# Patient Record
Sex: Male | Born: 1965 | Race: White | Hispanic: No | Marital: Married | Smoking: Former smoker
Health system: Southern US, Community
[De-identification: ages and names within clinical notes are randomized; demographics above are authoritative.]

## PROBLEM LIST (undated history)

## (undated) DIAGNOSIS — M1990 Unspecified osteoarthritis, unspecified site: Secondary | ICD-10-CM

## (undated) DIAGNOSIS — R3915 Urgency of urination: Secondary | ICD-10-CM

## (undated) DIAGNOSIS — R350 Frequency of micturition: Secondary | ICD-10-CM

## (undated) DIAGNOSIS — Z87442 Personal history of urinary calculi: Secondary | ICD-10-CM

---

## 2004-10-28 HISTORY — PX: EXTRACORPOREAL SHOCK WAVE LITHOTRIPSY: SHX1557

## 2010-08-09 ENCOUNTER — Emergency Department (HOSPITAL_COMMUNITY): Admission: EM | Admit: 2010-08-09 | Discharge: 2010-08-09 | Payer: Self-pay | Admitting: Emergency Medicine

## 2011-01-10 LAB — DIFFERENTIAL
Basophils Relative: 0 % (ref 0–1)
Lymphs Abs: 0.9 10*3/uL (ref 0.7–4.0)
Monocytes Absolute: 0.5 10*3/uL (ref 0.1–1.0)
Monocytes Relative: 6 % (ref 3–12)
Neutro Abs: 7.5 10*3/uL (ref 1.7–7.7)
Neutrophils Relative %: 84 % — ABNORMAL HIGH (ref 43–77)

## 2011-01-10 LAB — CBC
HCT: 47.1 % (ref 39.0–52.0)
Hemoglobin: 16.5 g/dL (ref 13.0–17.0)
MCHC: 35 g/dL (ref 30.0–36.0)
RBC: 4.96 MIL/uL (ref 4.22–5.81)

## 2011-01-10 LAB — BASIC METABOLIC PANEL
CO2: 27 mEq/L (ref 19–32)
GFR calc Af Amer: >60 mL/min (ref >60)
GFR calc non Af Amer: >60 mL/min (ref >60)
Glucose, Bld: 134 mg/dL — ABNORMAL HIGH (ref 70–99)
Potassium: 4.2 mEq/L (ref 3.5–5.1)
Sodium: 144 mEq/L (ref 135–145)

## 2013-05-02 ENCOUNTER — Encounter (HOSPITAL_BASED_OUTPATIENT_CLINIC_OR_DEPARTMENT_OTHER): Payer: Self-pay | Admitting: Emergency Medicine

## 2013-05-02 ENCOUNTER — Emergency Department (HOSPITAL_BASED_OUTPATIENT_CLINIC_OR_DEPARTMENT_OTHER)
Admission: EM | Admit: 2013-05-02 | Discharge: 2013-05-02 | Disposition: A | Discharge status: Home / Self Care | Payer: BC Managed Care – PPO | Attending: Emergency Medicine | Admitting: Emergency Medicine

## 2013-05-02 DIAGNOSIS — T782XXA Anaphylactic shock, unspecified, initial encounter: Secondary | ICD-10-CM

## 2013-05-02 DIAGNOSIS — L509 Urticaria, unspecified: Secondary | ICD-10-CM | POA: Insufficient documentation

## 2013-05-02 DIAGNOSIS — R000 Tachycardia, unspecified: Secondary | ICD-10-CM | POA: Insufficient documentation

## 2013-05-02 DIAGNOSIS — T63481A Toxic effect of venom of other arthropod, accidental (unintentional), initial encounter: Secondary | ICD-10-CM | POA: Insufficient documentation

## 2013-05-02 DIAGNOSIS — T6391XA Toxic effect of contact with unspecified venomous animal, accidental (unintentional), initial encounter: Secondary | ICD-10-CM | POA: Insufficient documentation

## 2013-05-02 DIAGNOSIS — Y929 Unspecified place or not applicable: Secondary | ICD-10-CM | POA: Insufficient documentation

## 2013-05-02 DIAGNOSIS — Y939 Activity, unspecified: Secondary | ICD-10-CM | POA: Insufficient documentation

## 2013-05-02 MED ORDER — EPINEPHRINE 0.3 MG/0.3ML IJ SOAJ: 0.3000 mg | INTRAMUSCULAR | Status: DC | PRN

## 2013-05-02 MED ORDER — DIPHENHYDRAMINE HCL 50 MG/ML IJ SOLN
25.0000 mg | Freq: Once | INTRAMUSCULAR | Status: AC
  Administered 2013-05-02: 25 mg via INTRAVENOUS
  Filled 2013-05-02: qty 1

## 2013-05-02 MED ORDER — FAMOTIDINE IN NACL 20-0.9 MG/50ML-% IV SOLN
20.0000 mg | Freq: Once | INTRAVENOUS | Status: AC
  Administered 2013-05-02: 20 mg via INTRAVENOUS
  Filled 2013-05-02: qty 50

## 2013-05-02 NOTE — ED Notes (Addendum)
Pt was stung x3 by yellowjackets in left thigh and left neck.  No pt h/o allergic reaction to stings, but quickkly felt tightness in throat and urticaria.  [facility] and given 125mg  solumedrol IM, and 25mg  benadryl IM at 1310 today.  Pt symptoms improvement within first 5 minutes.   EMS vital hr 102, 137/90, resp 18. Sat 97% on 3L.  Denies any allergies, medications or medical history.  20gauge IV in right AC placed by EMS.

## 2013-05-02 NOTE — ED Notes (Signed)
Cheese and crackers provided to pt and wife

## 2013-05-02 NOTE — ED Notes (Signed)
Patient to remain under ED observation for 4-6 hours per Dr Radford Pax.  IV running at [facility] rate with NS.

## 2013-05-02 NOTE — ED Provider Notes (Signed)
   History    CSN: [id number] Arrival date & time 05/02/13  1325  First MD Initiated Contact with Patient 05/02/13 1348     Chief Complaint  Patient presents with  . Allergic Reaction    HPI Pt was stung x3 by yellowjackets in left thigh and left neck. No pt h/o allergic reaction to stings, but quickkly felt tightness in throat and urticaria. [facility] and given 125mg  solumedrol IM, and 50mg  benadryl IM at 1310 today. Pt symptoms improvement within first 5 minutes. EMS vital hr 102, 137/90, resp 18. Sat 97% on 3L. Denies any allergies, medications or medical history. 20gauge IV in right AC placed by EMS.  History reviewed. No pertinent past medical history. History reviewed. No pertinent past surgical history. History reviewed. No pertinent family history. History  Substance Use Topics  . Smoking status: Not on file  . Smokeless tobacco: Never Used  . Alcohol Use: Yes     Comment: 4 drinks a day - beer wine or liquor    Review of Systems  Allergies  Bee venom  Home Medications   Current Outpatient Rx  Name  Route  Sig  Dispense  Refill  . diphenhydrAMINE (SOMINEX) 25 MG tablet   Intramuscular   Inject 25 mg into the muscle once.         Marland Kitchen EPINEPHrine 1 MG/ML SOLN   Intramuscular   Inject 0.3 mg into the muscle once.         . methylPREDNISolone sodium succinate (SOLU-MEDROL) 125 mg/2 mL injection   Intramuscular   Inject 125 mg into the muscle once.         Marland Kitchen EPINEPHrine (EPIPEN) 0.3 mg/0.3 mL SOAJ   Intramuscular   Inject 0.3 mLs (0.3 mg total) into the muscle as needed.   2 Device   0    BP 130/77  Pulse 94  Temp(Src) 97.7 F (36.5 C) (Oral)  Resp 14  Ht 5\' 9"  (1.753 m)  Wt 230 lb (104.327 kg)  BMI 33.95 kg/m2  SpO2 99% Physical Exam  Nursing note and vitals reviewed. Constitutional: He is oriented to person, place, and time. He appears well-developed and well-nourished. No distress.  HENT:  Head: Normocephalic and  atraumatic.  Eyes: Pupils are equal, round, and reactive to light.  Neck: Normal range of motion.  Cardiovascular: Intact distal pulses.  Tachycardia present.   Pulmonary/Chest: No respiratory distress. He has no wheezes.  Abdominal: Normal appearance. He exhibits no distension.  Musculoskeletal: Normal range of motion.  Neurological: He is alert and oriented to person, place, and time. No cranial nerve deficit.  Skin: Skin is warm and dry. Rash (Urticarial rash noted chest and left neck area.) noted.  Psychiatric: He has a normal mood and affect. His behavior is normal.    ED Course  Procedures (including critical care time) Medications  famotidine (PEPCID) IVPB 20 mg (0 mg Intravenous Stopped 05/02/13 1431)  diphenhydrAMINE (BENADRYL) injection 25 mg (25 mg Intravenous Given 05/02/13 1500)    Labs Reviewed - No data to display No results found. 1. Anaphylactic reaction, initial encounter   2. Wasp sting, initial encounter     MDM    Nelia Shi, MD 05/02/13 [phone]

## 2013-05-02 NOTE — ED Notes (Signed)
Ginger Ale provided to pt 

## 2016-04-15 ENCOUNTER — Other Ambulatory Visit: Payer: Self-pay | Admitting: Urology

## 2016-05-07 ENCOUNTER — Encounter (HOSPITAL_BASED_OUTPATIENT_CLINIC_OR_DEPARTMENT_OTHER): Payer: Self-pay | Admitting: *Deleted

## 2016-05-07 NOTE — Progress Notes (Signed)
NPO AFTER MN.  ARRIVE AT 1030.  NEEDS HG.  

## 2016-05-15 ENCOUNTER — Ambulatory Visit (HOSPITAL_BASED_OUTPATIENT_CLINIC_OR_DEPARTMENT_OTHER): Payer: Managed Care, Other (non HMO) | Admitting: Anesthesiology

## 2016-05-15 ENCOUNTER — Ambulatory Visit (HOSPITAL_BASED_OUTPATIENT_CLINIC_OR_DEPARTMENT_OTHER)
Admission: RE | Admit: 2016-05-15 | Discharge: 2016-05-15 | Disposition: A | Discharge status: Home / Self Care | Payer: Managed Care, Other (non HMO) | Source: Ambulatory Visit | Attending: Urology | Admitting: Urology

## 2016-05-15 ENCOUNTER — Encounter (HOSPITAL_BASED_OUTPATIENT_CLINIC_OR_DEPARTMENT_OTHER)
Admission: RE | Disposition: A | Discharge status: Home / Self Care | Payer: Self-pay | Source: Ambulatory Visit | Attending: Urology

## 2016-05-15 ENCOUNTER — Encounter (HOSPITAL_BASED_OUTPATIENT_CLINIC_OR_DEPARTMENT_OTHER): Payer: Self-pay

## 2016-05-15 DIAGNOSIS — M1990 Unspecified osteoarthritis, unspecified site: Secondary | ICD-10-CM | POA: Insufficient documentation

## 2016-05-15 DIAGNOSIS — Z87891 Personal history of nicotine dependence: Secondary | ICD-10-CM | POA: Diagnosis not present

## 2016-05-15 DIAGNOSIS — Z302 Encounter for sterilization: Secondary | ICD-10-CM | POA: Insufficient documentation

## 2016-05-15 HISTORY — DX: Personal history of urinary calculi: Z87.442

## 2016-05-15 HISTORY — DX: Unspecified osteoarthritis, unspecified site: M19.90

## 2016-05-15 HISTORY — DX: Urgency of urination: R39.15

## 2016-05-15 HISTORY — DX: Frequency of micturition: R35.0

## 2016-05-15 HISTORY — PX: VASECTOMY: SHX75

## 2016-05-15 LAB — POCT HEMOGLOBIN-HEMACUE: HEMOGLOBIN: 15.1 g/dL (ref 13.0–17.0)

## 2016-05-15 SURGERY — VASECTOMY
Anesthesia: General | Site: Scrotum | Laterality: Bilateral

## 2016-05-15 MED ORDER — MIDAZOLAM HCL 5 MG/5ML IJ SOLN
INTRAMUSCULAR | Status: DC | PRN
  Administered 2016-05-15: 2 mg via INTRAVENOUS

## 2016-05-15 MED ORDER — LACTATED RINGERS IV SOLN
INTRAVENOUS | Status: DC
  Administered 2016-05-15 (×2): via INTRAVENOUS
  Filled 2016-05-15: qty 1000

## 2016-05-15 MED ORDER — OXYCODONE HCL 5 MG/5ML PO SOLN
5.0000 mg | Freq: Once | ORAL | Status: DC | PRN
  Filled 2016-05-15: qty 5

## 2016-05-15 MED ORDER — TRAMADOL HCL 50 MG PO TABS: 50.0000 mg | ORAL_TABLET | Freq: Four times a day (QID) | ORAL | Status: DC | PRN

## 2016-05-15 MED ORDER — FENTANYL CITRATE (PF) 100 MCG/2ML IJ SOLN
INTRAMUSCULAR | Status: AC
  Filled 2016-05-15: qty 2

## 2016-05-15 MED ORDER — SODIUM CHLORIDE 0.9 % IR SOLN
Status: DC | PRN
  Administered 2016-05-15: 500 mL

## 2016-05-15 MED ORDER — PROPOFOL 10 MG/ML IV BOLUS
INTRAVENOUS | Status: DC | PRN
  Administered 2016-05-15: 200 mg via INTRAVENOUS

## 2016-05-15 MED ORDER — FENTANYL CITRATE (PF) 100 MCG/2ML IJ SOLN
INTRAMUSCULAR | Status: DC | PRN
  Administered 2016-05-15 (×3): 50 ug via INTRAVENOUS

## 2016-05-15 MED ORDER — LIDOCAINE HCL (CARDIAC) 20 MG/ML IV SOLN
INTRAVENOUS | Status: DC | PRN
  Administered 2016-05-15: 100 mg via INTRAVENOUS

## 2016-05-15 MED ORDER — DEXAMETHASONE SODIUM PHOSPHATE 10 MG/ML IJ SOLN
INTRAMUSCULAR | Status: AC
  Filled 2016-05-15: qty 1

## 2016-05-15 MED ORDER — PROPOFOL 10 MG/ML IV BOLUS
INTRAVENOUS | Status: AC
  Filled 2016-05-15: qty 40

## 2016-05-15 MED ORDER — DEXAMETHASONE SODIUM PHOSPHATE 4 MG/ML IJ SOLN
INTRAMUSCULAR | Status: DC | PRN
  Administered 2016-05-15: 10 mg via INTRAVENOUS

## 2016-05-15 MED ORDER — FENTANYL CITRATE (PF) 100 MCG/2ML IJ SOLN
25.0000 ug | INTRAMUSCULAR | Status: DC | PRN
  Filled 2016-05-15: qty 1

## 2016-05-15 MED ORDER — LIDOCAINE HCL (CARDIAC) 20 MG/ML IV SOLN
INTRAVENOUS | Status: AC
  Filled 2016-05-15: qty 5

## 2016-05-15 MED ORDER — BUPIVACAINE HCL (PF) 0.25 % IJ SOLN
INTRAMUSCULAR | Status: DC | PRN
  Administered 2016-05-15: 2 mL

## 2016-05-15 MED ORDER — ONDANSETRON HCL 4 MG/2ML IJ SOLN
INTRAMUSCULAR | Status: DC | PRN
  Administered 2016-05-15: 4 mg via INTRAVENOUS

## 2016-05-15 MED ORDER — OXYCODONE HCL 5 MG PO TABS
5.0000 mg | ORAL_TABLET | Freq: Once | ORAL | Status: DC | PRN
  Filled 2016-05-15: qty 1

## 2016-05-15 MED ORDER — CLINDAMYCIN PHOSPHATE 900 MG/50ML IV SOLN
900.0000 mg | INTRAVENOUS | Status: AC
  Administered 2016-05-15: 900 mg via INTRAVENOUS
  Filled 2016-05-15: qty 50

## 2016-05-15 MED ORDER — PROMETHAZINE HCL 25 MG/ML IJ SOLN
6.2500 mg | INTRAMUSCULAR | Status: DC | PRN
  Filled 2016-05-15: qty 1

## 2016-05-15 MED ORDER — ONDANSETRON HCL 4 MG/2ML IJ SOLN
INTRAMUSCULAR | Status: AC
  Filled 2016-05-15: qty 2

## 2016-05-15 MED ORDER — CLINDAMYCIN PHOSPHATE 900 MG/50ML IV SOLN
INTRAVENOUS | Status: AC
  Filled 2016-05-15: qty 50

## 2016-05-15 MED ORDER — MIDAZOLAM HCL 2 MG/2ML IJ SOLN
INTRAMUSCULAR | Status: AC
  Filled 2016-05-15: qty 2

## 2016-05-15 SURGICAL SUPPLY — 29 items
BLADE SURG 15 STRL LF DISP TIS (BLADE) ×1 IMPLANT
BLADE SURG 15 STRL SS (BLADE) ×1
BNDG GAUZE ELAST 4 BULKY (GAUZE/BANDAGES/DRESSINGS) IMPLANT
CLOTH BEACON ORANGE TIMEOUT ST (SAFETY) ×2 IMPLANT
COVER BACK TABLE 60X90IN (DRAPES) ×2 IMPLANT
COVER MAYO STAND STRL (DRAPES) ×2 IMPLANT
DRAPE LAPAROTOMY 100X72 PEDS (DRAPES) ×2 IMPLANT
ELECT NEEDLE TIP 2.8 STRL (NEEDLE) ×2 IMPLANT
ELECT REM PT RETURN 9FT ADLT (ELECTROSURGICAL) ×2
ELECTRODE REM PT RTRN 9FT ADLT (ELECTROSURGICAL) ×1 IMPLANT
GLOVE BIO SURGEON STRL SZ7.5 (GLOVE) ×2 IMPLANT
GLOVE INDICATOR 7.5 STRL GRN (GLOVE) ×4 IMPLANT
GOWN STRL REUS W/ TWL XL LVL3 (GOWN DISPOSABLE) ×1 IMPLANT
GOWN STRL REUS W/TWL LRG LVL3 (GOWN DISPOSABLE) ×2 IMPLANT
GOWN STRL REUS W/TWL XL LVL3 (GOWN DISPOSABLE) ×1
KIT ROOM TURNOVER WOR (KITS) ×2 IMPLANT
NEEDLE HYPO 25X1 1.5 SAFETY (NEEDLE) ×2 IMPLANT
NEEDLE HYPO 25X5/8 SAFETYGLIDE (NEEDLE) IMPLANT
NS IRRIG 500ML POUR BTL (IV SOLUTION) ×2 IMPLANT
PACK BASIN DAY SURGERY FS (CUSTOM PROCEDURE TRAY) ×2 IMPLANT
PENCIL BUTTON HOLSTER BLD 10FT (ELECTRODE) ×2 IMPLANT
SUPPORT SCROTAL LG STRP (MISCELLANEOUS) ×2 IMPLANT
SUT CHROMIC 3 0 PS 2 (SUTURE) ×2 IMPLANT
SUT SILK 2 0 (SUTURE) ×1
SUT SILK 2-0 18XBRD TIE 12 (SUTURE) ×1 IMPLANT
SYR CONTROL 10ML LL (SYRINGE) ×2 IMPLANT
TOWEL OR 17X24 6PK STRL BLUE (TOWEL DISPOSABLE) ×2 IMPLANT
TRAY DSU PREP LF (CUSTOM PROCEDURE TRAY) ×2 IMPLANT
TUBE CONNECTING 12X1/4 (SUCTIONS) ×2 IMPLANT

## 2016-05-15 NOTE — Discharge Instructions (Signed)
°  Post Anesthesia Home Care Instructions  Activity: Get plenty of rest for the remainder of the day. A responsible adult should stay with you for 24 hours following the procedure.  For the next 24 hours, DO NOT: -Drive a car -Advertising copywriterperate machinery -Drink alcoholic beverages -Take any medication unless instructed by your physician -Make any legal decisions or sign important papers.  Meals: Start with liquid foods such as gelatin or soup. Progress to regular foods as tolerated. Avoid greasy, spicy, heavy foods. If nausea and/or vomiting occur, drink only clear liquids until the nausea and/or vomiting subsides. Call your physician if vomiting continues.  Special Instructions/Symptoms: Your throat may feel dry or sore from the anesthesia or the breathing tube placed in your throat during surgery. If this causes discomfort, gargle with warm salt water. The discomfort should disappear within 24 hours.  If you had a scopolamine patch placed behind your ear for the management of post- operative nausea and/or vomiting:  1. The medication in the patch is effective for 72 hours, after which it should be removed.  Wrap patch in a tissue and discard in the trash. Wash hands thoroughly with soap and water. 2. You may remove the patch earlier than 72 hours if you experience unpleasant side effects which may include dry mouth, dizziness or visual disturbances. 3. Avoid touching the patch. Wash your hands with soap and water after contact with the patch.      1 - You may have scrotal soreness / bruising x few days. This is normal.  2 - Call MD or go to ER for fever >102, severe pain / nausea / vomiting not relieved by medications, SEVERE scrotal swelling, or acute change in medical status

## 2016-05-15 NOTE — Anesthesia Preprocedure Evaluation (Addendum)
Anesthesia Evaluation  Patient identified by MRN, date of birth, ID band Patient awake    Reviewed: Allergy & Precautions, NPO status , Patient's Chart, lab work & pertinent test results  Airway Mallampati: II  TM Distance: >3 FB Neck ROM: Full    Dental no notable dental hx.    Pulmonary neg pulmonary ROS, former smoker,    Pulmonary exam normal breath sounds clear to auscultation       Cardiovascular negative cardio ROS Normal cardiovascular exam Rhythm:Regular Rate:Normal     Neuro/Psych negative neurological ROS  negative psych ROS   GI/Hepatic negative GI ROS, Neg liver ROS,   Endo/Other  negative endocrine ROS  Renal/GU negative Renal ROS  negative genitourinary   Musculoskeletal negative musculoskeletal ROS (+)   Abdominal   Peds negative pediatric ROS (+)  Hematology negative hematology ROS (+)   Anesthesia Other Findings   Reproductive/Obstetrics negative OB ROS                             Anesthesia Physical Anesthesia Plan  ASA: I  Anesthesia Plan: General   Post-op Pain Management:    Induction: Intravenous  Airway Management Planned: LMA  Additional Equipment:   Intra-op Plan:   Post-operative Plan: Extubation in OR  Informed Consent: I have reviewed the patients History and Physical, chart, labs and discussed the procedure including the risks, benefits and alternatives for the proposed anesthesia with the patient or authorized representative who has indicated his/her understanding and acceptance.   Dental advisory given  Plan Discussed with: CRNA and Surgeon  Anesthesia Plan Comments:         Anesthesia Quick Evaluation  

## 2016-05-15 NOTE — Brief Op Note (Signed)
05/15/2016  12:03 PM  PATIENT:  Patrick Barr  50 y.o. male  PRE-OPERATIVE DIAGNOSIS:  DESIRE FOR MALE STERILIZATION  POST-OPERATIVE DIAGNOSIS:  DESIRE FOR MALE STERILIZATION  PROCEDURE:  Procedure(s): VASECTOMY (Bilateral)  SURGEON:  Surgeon(s) and Role:    * Sebastian Acheheodore , MD - Primary  PHYSICIAN ASSISTANT:   ASSISTANTS: none   ANESTHESIA:   local and general  EBL:     BLOOD ADMINISTERED:none  DRAINS: none   LOCAL MEDICATIONS USED:  MARCAINE     SPECIMEN:  Source of Specimen:  bilateral vas segments  DISPOSITION OF SPECIMEN:  discard  COUNTS:  discard  TOURNIQUET:  * No tourniquets in log *  DICTATION: .Other Dictation: Dictation Number [id number]  PLAN OF CARE: Discharge to home after PACU  PATIENT DISPOSITION:  PACU - hemodynamically stable.   Delay start of Pharmacological VTE agent (>24hrs) due to surgical blood loss or risk of bleeding: yes

## 2016-05-15 NOTE — Anesthesia Procedure Notes (Signed)
Procedure Name: LMA Insertion Date/Time: 05/15/2016 11:37 AM Performed by: Norva PavlovALLAWAY,  G Pre-anesthesia Checklist: Patient identified, Emergency Drugs available, Suction available and Patient being monitored Patient Re-evaluated:Patient Re-evaluated prior to inductionOxygen Delivery Method: Circle System Utilized Preoxygenation: Pre-oxygenation with 100% oxygen Intubation Type: IV induction Ventilation: Mask ventilation without difficulty LMA: LMA inserted LMA Size: 4.0 Number of attempts: 1 Airway Equipment and Method: bite block Placement Confirmation: positive ETCO2 Tube secured with: Tape Dental Injury: Teeth and Oropharynx as per pre-operative assessment

## 2016-05-15 NOTE — Anesthesia Postprocedure Evaluation (Signed)
Anesthesia Post Note  Patient: Patrick Barr  Procedure(s) Performed: Procedure(s) (LRB): VASECTOMY (Bilateral)  Patient location during evaluation: PACU Anesthesia Type: General Level of consciousness: sedated and patient cooperative Pain management: pain level controlled Vital Signs Assessment: post-procedure vital signs reviewed and stable Respiratory status: spontaneous breathing Cardiovascular status: stable Anesthetic complications: no    Last Vitals:  Filed Vitals:   05/15/16 1245 05/15/16 1343  BP: 115/74 116/73  Pulse: 47 48  Temp:  36.8 C  Resp: 14 14    Last Pain: There were no vitals filed for this visit.               Lewie LoronJohn 

## 2016-05-15 NOTE — Transfer of Care (Addendum)
    Last Vitals:  Filed Vitals:   05/15/16 1217 05/15/16 1218  BP: 124/85   Pulse:  67  Temp:  36.5 C  Resp:  19    Last Pain: There were no vitals filed for this visit.    Patients Stated Pain Goal: 7 (05/15/16 1017)  Immediate Anesthesia Transfer of Care Note  Patient: Patrick Barr  Procedure(s) Performed: Procedure(s) (LRB): VASECTOMY (Bilateral)  Patient Location: PACU  Anesthesia Type: General  Level of Consciousness: awake, alert  and oriented  Airway & Oxygen Therapy: Patient Spontanous Breathing and Patient connected to nasal cannula oxygen  Post-op Assessment: Report given to PACU RN and Post -op Vital signs reviewed and stable  Post vital signs: Reviewed and stable  Complications: No apparent anesthesia complications

## 2016-05-15 NOTE — H&P (Signed)
Patrick Barr is an 50 y.o. male.    Chief Complaint: Pre-op Vasectomy under anesthesia  HPI:   1 - Desire for Male Sterilization -  pt with one grown child. He and his wife have been considering vasectomy as means of sterilzation for >1 year. No h/o scrotal trauma. He does have very thick cords and abbreviated scrotal tissue which would make office procedure problematic.   PMH sig otherwise unremarkable. HIs PCP is Dibas Koirala MD   Today " Patrick" is seen to proceed with operative vasectomy.     Past Medical History  Diagnosis Date  . History of kidney stones   . Frequency of urination   . Urgency of urination   . Arthritis     Past Surgical History  Procedure Laterality Date  . Extracorporeal shock wave lithotripsy  2006    History reviewed. No pertinent family history. Social History:  reports that he quit smoking about 20 years ago. His smoking use included Cigarettes. He quit after 5 years of use. He has never used smokeless tobacco. He reports that he drinks alcohol. His drug history is not on file.  Allergies:  Allergies  Allergen Reactions  . Bee Venom Shortness Of Breath, Itching and Swelling    No prescriptions prior to admission    No results found for this or any previous visit (from the past 48 hour(s)). No results found.  Review of Systems  Constitutional: Negative.   HENT: Negative.   Eyes: Negative.   Respiratory: Negative.   Cardiovascular: Negative.   Gastrointestinal: Negative.   Genitourinary: Negative.  Negative for hematuria and flank pain.  Musculoskeletal: Negative.   Skin: Negative.   Neurological: Negative.   Endo/Heme/Allergies: Negative.   Psychiatric/Behavioral: Negative.     Height 5\' 11"  (1.803 m), weight 95.255 kg (210 lb). Physical Exam  Constitutional: He appears well-developed.  HENT:  Head: Normocephalic.  Eyes: Pupils are equal, round, and reactive to light.  Neck: Normal range of motion.  Cardiovascular: Normal  rate.   Respiratory: Effort normal.  GI: Soft.  Genitourinary:  Thick cords and abreviated scrotal tissue.   Musculoskeletal: Normal range of motion.  Neurological: He is alert.  Skin: Skin is warm.  Psychiatric: He has a normal mood and affect. His behavior is normal. Judgment and thought content normal.     Assessment/Plan  1 - Desire for Male Sterilization -   Proceed as planned with vasectomy under anesthesia.  Risks, benefits, alternatives discussed in detail. He is resolute in his deciison. OR would be best given his anatomy.     Sebastian AcheMANNY, , MD 05/15/2016, 6:46 AM

## 2016-05-16 NOTE — Op Note (Signed)
NAMMarland Kitchen:  [name], Patrick               ACCOUNT NO.:  [id number]  MEDICAL RECORD NO.:  [id number]  LOCATION:                                 FACILITY:  PHYSICIAN:  Sebastian Acheheodore , MD     DATE OF BIRTH:  06-12-1966  DATE OF PROCEDURE: 05/15/2016                               OPERATIVE REPORT   DIAGNOSIS:  Desire for male sterilization.  PROCEDURE:  Vasectomy under anesthesia.  ESTIMATED BLOOD LOSS:  Nil.  COMPLICATION:  None.  SPECIMENS:  Bilateral vas segments for discard.  FINDINGS: 1. Much more favorable anatomy under anesthesia with less scrotal     abbreviation. 2. Successful transection of bilateral vas deferens.  INDICATION:  Patrick Barr is a very pleasant 50 year old gentleman, who desired male sterilization.  He and his wife had been considering this for well over 6 months and he wished to proceed with vasectomy.  Exam in the office revealed somewhat unfavorable anatomy with very abbreviated scrotum as well as a very thick cords.  Given his anatomy, he was counseled toward vasectomy under anesthesia as being the recommended approach and he wished to proceed.  Informed consent was obtained and placed in the medical record.  PROCEDURE IN DETAIL:  The patient being Patrick Barr, was verified. Procedure being vasectomy was confirmed.  Procedure was carried out. Time-out was performed.  Intravenous antibiotics were administered. General LMA anesthesia was introduced.  The patient was placed into a supine position and sterile field was created by first clipper shaving and prepping and draping the patient's penis, perineum and scrotum using iodine.  Under anesthesia, the patient had much less tone of his scrotal musculature and the vas were much more easily palpated, although his cords that remained somewhat thickened.  Initially attention was directed at left-sided vasectomy.  The left vas was easily palpated and brought to the left lateral hemiscrotum where using the  sharp dissector, an approximately 8-mm incision was made through the skin down to the vas deferens, which was grabbed with vas clamp and carefully dissected down to isolate the vas, which was then delivered in the operative field.  It was doubly clamped, an approximately 1 cm segment was taken out of continuity, it was inspected and visualized the abdomen consistent with left vas deferens.  The edges of the retained edges were cauterized and then ligated each with a silk suture.  Hemostasis appeared excellent. The left vas was redelivered to the hemiscrotum and was closed at the level of the skin using a single mattress chromic stitch.  A mirror image procedure was performed on the right side again excising 1-cm segment verifying lumen through a separate right lateral scrotal incision that was closed similarly as well.  A 1 mL of local anesthetic was infiltrated into the incision on each side.  Procedure was then terminated.  The patient tolerated the procedure well.  There were no immediate periprocedural complications. The patient was taken to the postanesthesia care unit in stable condition.    ______________________________ Sebastian Acheheodore , MD   ______________________________ Sebastian Acheheodore , MD    TM/MEDQ  D:  05/15/2016  T:  05/15/2016  Job:  [id number]

## 2016-05-17 ENCOUNTER — Encounter (HOSPITAL_BASED_OUTPATIENT_CLINIC_OR_DEPARTMENT_OTHER): Payer: Self-pay | Admitting: Urology

## 2016-10-28 HISTORY — PX: HERNIA REPAIR: SHX51

## 2016-11-21 ENCOUNTER — Ambulatory Visit: Payer: Managed Care, Other (non HMO) | Attending: Adult Health | Admitting: Physical Therapy

## 2016-11-21 ENCOUNTER — Encounter: Payer: Self-pay | Admitting: Physical Therapy

## 2016-11-21 DIAGNOSIS — M62838 Other muscle spasm: Secondary | ICD-10-CM | POA: Insufficient documentation

## 2016-11-21 DIAGNOSIS — M6281 Muscle weakness (generalized): Secondary | ICD-10-CM | POA: Diagnosis present

## 2016-11-21 DIAGNOSIS — R279 Unspecified lack of coordination: Secondary | ICD-10-CM | POA: Diagnosis present

## 2016-11-21 NOTE — Patient Instructions (Addendum)
Relaxation Exercises with the Urge to Void   When you experience an urge to void:  FIRST  Stop and stand very still    Sit down if you can    Don't move    You need to stay very still to maintain control  SECOND Squeeze your pelvic floor muscles 5 times, like a quick flick, to keep from leaking  THIRD Relax  Take a deep breath and then let it out  Try to make the urge go away by using relaxation and visualization techniques  FINALLY When you feel the urge go away somewhat, walk normally to the bathroom.   If the urge gets suddenly stronger on the way, you may stop again and relax to regain control.   Diaphragmatic    Hands on navel, inhale through nose making navel move out toward hands. Exhale through puckered lips, hands follow exhalation in. Repeat _5__ times. Rest _2__ seconds between repeats. Do _3__ times per day.  Copyright  VHI. All rights reserved.     [facility] [address], [address] [place], [place] [address] Phone # [phone] Fax [phone]   [name], PT 11/21/16 12:15 PM

## 2016-11-24 NOTE — Therapy (Signed)
[facility] [address], [address] [place], [place], [address] Phone: [phone]   Fax:  [phone]  Physical Therapy Evaluation  Patient Details  Name: Patrick Barr MRN: [id number] Date of Birth: February 17, 1966 Referring Provider: Jetta Lout, NP  Encounter Date: 11/21/2016      PT End of Session - 11/24/16 1624    Visit Number 1   Date for PT Re-Evaluation 03/16/17   PT Start Time 1445   PT Stop Time 1530   PT Time Calculation (min) 45 min   Activity Tolerance Patient tolerated treatment well   Behavior During Therapy [facility] for tasks assessed/performed      Past Medical History:  Diagnosis Date  . Arthritis   . Frequency of urination   . History of kidney stones   . Urgency of urination     Past Surgical History:  Procedure Laterality Date  . EXTRACORPOREAL SHOCK WAVE LITHOTRIPSY  2006  . VASECTOMY Bilateral 05/15/2016   Procedure: VASECTOMY;  Surgeon: Sebastian Ache, MD;  Location: [facility];  Service: Urology;  Laterality: Bilateral;    There were no vitals filed for this visit.       Subjective Assessment - 11/24/16 1641    Subjective Has had this for a long time since 51 y/o.  Has been getting worse and has pain assoiciated with very "bad pee days".  Had this his whole life.  Not been eating/drinking spice, citris and limits caffiene and alcohol.  Goes every 15 minutes or more when it's bad.  Urinates 8-12 seconds moderate stream when he does.     Limitations Other (comment)  working   Patient Stated Goals does not want to effect work and get rid of the pain.   Currently in Pain? Yes   Pain Score 5    Pain Location Abdomen   Pain Orientation Lower   Pain Descriptors / Indicators Aching;Cramping   Pain Type Chronic pain   Pain Onset More than a month ago   Pain Frequency Intermittent   Aggravating Factors  having urgency   Pain Relieving Factors heat   Effect of Pain on Daily  Activities effects ability to get work done   Multiple Pain Sites No            [facility] PT Assessment - 11/24/16 0001      Assessment   Medical Diagnosis R35.0 frequency of mictruition   Referring Provider Jetta Lout, NP   Onset Date/Surgical Date --  chronic   Prior Therapy None     Precautions   Precautions None     Restrictions   Weight Bearing Restrictions No     Home Environment   Living Environment Private residence   Living Arrangements Spouse/significant other     Prior Function   Level of Independence Independent   Vocation Requirements sitting     Cognition   Overall Cognitive Status Within Functional Limits for tasks assessed     Observation/Other Assessments   Focus on Therapeutic Outcomes (FOTO)  38% limitation     Posture/Postural Control   Posture/Postural Control Postural limitations   Postural Limitations Rounded Shoulders;Increased thoracic kyphosis     Ambulation/Gait   Gait Pattern Within Functional Limits                 Pelvic Floor Special Questions - 11/24/16 0001    Currently Sexually Active Yes   Is this Painful No   Marinoff Scale no problems   Urinary Leakage  No  maybe 1x / year dribble   Urinary urgency Yes  on a bad day   Urinary frequency minimum 1x, usually 2x at night, during the day every 10-15 minutes   Fecal incontinence No   Fluid intake 10 glasses of water   Caffeine beverages 1   Falling out feeling (prolapse) No   Skin Integrity Intact   Pelvic Floor Internal Exam pt was informed and consent to perofrm internal soft tissue assessment was given   Exam Type Rectal   Palpation levator group, coccygeus, external and internal sphincter tender and tight; unable to palpate puborectalis contraction   Strength weak squeeze, no lift   Strength # of reps 2   Strength # of seconds 3                  PT Education - 11/24/16 1624    Education provided Yes   Education Details urge to void and balloon  breathing   Person(s) Educated Patient   Methods Explanation;Handout   Comprehension Verbalized understanding          PT Short Term Goals - 11/24/16 1632      PT SHORT TERM GOAL #1   Title independent with initial HEP   Time 4   Period Weeks   Status New     PT SHORT TERM GOAL #2   Title pain reduced by 50%   Time 4   Period Weeks   Status New     PT SHORT TERM GOAL #3   Title Reduced voiding to 1x/hour on a bad day for improved function at work   Time 4   Period Weeks   Status New           PT Long Term Goals - 11/24/16 1634      PT LONG TERM GOAL #1   Title FOTO < or = to 29% limitation on urinary problems survey   Baseline 38%   Time 16   Period Weeks   Status New     PT LONG TERM GOAL #2   Title Pt able to effectively use pelvic floor contractions and relaxation techniques to supress the urge to void   Time 16   Period Weeks   Status New     PT LONG TERM GOAL #3   Title improve pelvic floor soft tissue relaxation for reduction in pain by 75%   Time 16   Period Weeks   Status New     PT LONG TERM GOAL #4   Title pt able to void 1x every 2hours at work for improved function and decreased distractions   Time 16   Period Weeks   Status New               Plan - 11/24/16 1642    Clinical Impression Statement Pt presents to clinic with low complexity evaluation due to no comorbidities effecting treatment. Pt has pelvic floor muscle spasms and weakness of 2/5 MMT. Pt has had this issue ongoing since approximately age 51 and has progressively gotten worse. Pt demonstrates lack of coordinatoin and unable to contract puborectalis muscle during contraction. Pt will benefit from skilled PT to address impaiments for improved pelvic floor functioning so he will be able to function at his job without interruptions of urinary frequency.   Rehab Potential Excellent   Clinical Impairments Affecting Rehab Potential none   PT Frequency 2x / week   PT  Duration Other (comment)  16   PT  Treatment/Interventions ADLs/Self Care Home Management;Biofeedback;Cryotherapy;Electrical Stimulation;Moist Heat;Therapeutic activities;Therapeutic exercise;Neuromuscular re-education;Patient/family education;Manual techniques   PT Next Visit Plan bladder diary (clarify 8 sec at moderate stream even on bad days?), abdominal assessment and STM   PT Home Exercise Plan review urge to void and diaphragmatic breathing   Recommended Other Services none   Consulted and Agree with Plan of Care Patient      Patient will benefit from skilled therapeutic intervention in order to improve the following deficits and impairments:  Decreased coordination, Decreased strength, Pain, Increased muscle spasms  Visit Diagnosis: Other muscle spasm  Muscle weakness (generalized)  Unspecified lack of coordination     Problem List There are no active problems to display for this patient.   [name], PT 11/24/2016, 4:43 PM  [facility] [address], [address] [place], [place], [address] Phone: [phone]   Fax:  [phone]  Name: Patrick Barr MRN: [id number] Date of Birth: 1965-12-25

## 2016-11-26 ENCOUNTER — Ambulatory Visit: Payer: Managed Care, Other (non HMO) | Admitting: Physical Therapy

## 2016-11-26 ENCOUNTER — Encounter: Payer: Self-pay | Admitting: Physical Therapy

## 2016-11-26 DIAGNOSIS — M62838 Other muscle spasm: Secondary | ICD-10-CM

## 2016-11-26 DIAGNOSIS — R279 Unspecified lack of coordination: Secondary | ICD-10-CM

## 2016-11-26 DIAGNOSIS — M6281 Muscle weakness (generalized): Secondary | ICD-10-CM

## 2016-11-26 NOTE — Patient Instructions (Addendum)
  Position yourself as shown grabbing onto feet or behind the knees. You should feel a gentle stretch. Breathe in and allow the pelvic floor muscles to relax. Hold 1 min. 2 times per day.  Adductors, Frog Squat    Crouch with elbows inside knees . Gently push knees outward. Hold _30__ seconds.1 Repeat __1_ times per session. Do _2__ sessions per day.  Copyright  VHI. All rights reserved.   BACK: Child's Pose (Sciatica)    Sit in knee-chest position and reach arms forward. Separate knees for comfort. Hold position for _60__ breaths. Repeat _2__ times. Do _2__ times per day.  Copyright  VHI. All rights reserved.   Piriformis Stretch    Lying on back, pull right knee toward opposite shoulder. Hold _30___ seconds. Repeat __2__ times. Do _1___ sessions per day.  http://gt2.exer.us/258   Copyright  VHI. All rights reserved.   Piriformis Stretch, Supine    Lie supine, one ankle crossed onto opposite knee. Holding bottom leg behind knee, gently pull legs toward chest until stretch is felt in buttock of top leg. Hold _30__ seconds. For deeper stretch gently push top knee away from body.  Repeat _2__ times per session. Do _2__ sessions per day.  Copyright  VHI. All rights reserved.    Supine Knee-to-Chest, Unilateral    Lie on back, hands clasped behind one knee. Pull knee in toward chest until a comfortable stretch is felt in lower back and buttocks. Hold 30___ seconds.  Repeat __2_ times per session. Do _1__ sessions per day.  Copyright  VHI. All rights reserved.  Supine With Rotation    Lie on back with one knee drawn toward chest. Slowly bring bent leg across body until stretch is felt in lower back area. Hold _30__ seconds. Repeat to other side. Repeat _2__ times per session. Do __2_ sessions per day.  Copyright  VHI. All rights reserved.  Butterfly, Supine    Lie on back, feet together. Lower knees toward floor. Hold 60___ seconds. Can use pillows under  knees if you need to. Repeat _2__ times per session. Do _1__ sessions per day.  Copyright  VHI. All rights reserved.   Piriformis Stretch, Sitting    Sit, one ankle on opposite knee, same-side hand on crossed knee. Push down on knee, keeping spine straight. Lean torso forward, with flat back, until tension is felt in hamstrings and gluteals of crossed-leg side. Hold 30___ seconds.  Repeat __3_ times per session. Do _1_ sessions per day.  Copyright  VHI. All rights reserved.    Chair Sitting    Sit at edge of seat, spine straight, one leg extended. Put a hand on each thigh and bend forward from the hip, keeping spine straight. Allow hand on extended leg to reach toward toes. Support upper body with other arm. Hold _30__ seconds. Repeat __3_ times per session. Do _1__ sessions per day.  Copyright  VHI. All rights reserved.

## 2016-11-27 NOTE — Therapy (Signed)
[facility] [address] [place], [place], [address] Phone: [phone]   Fax:  [phone]  Physical Therapy Treatment  Patient Details  Name: Patrick Barr MRN: [id number] Date of Birth: May 19, 1966 Referring Provider: Jetta Lout, NP  Encounter Date: 11/26/2016      PT End of Session - 11/26/16 1611    Visit Number 2   Date for PT Re-Evaluation 03/16/17   PT Start Time 1531   PT Stop Time 1610   PT Time Calculation (min) 39 min   Activity Tolerance Patient tolerated treatment well   Behavior During Therapy [facility] for tasks assessed/performed      Past Medical History:  Diagnosis Date  . Arthritis   . Frequency of urination   . History of kidney stones   . Urgency of urination     Past Surgical History:  Procedure Laterality Date  . EXTRACORPOREAL SHOCK WAVE LITHOTRIPSY  2006  . VASECTOMY Bilateral 05/15/2016   Procedure: VASECTOMY;  Surgeon: Sebastian Ache, MD;  Location: [facility];  Service: Urology;  Laterality: Bilateral;    There were no vitals filed for this visit.      Subjective Assessment - 11/26/16 1549    Subjective Peed 6 times since 11am this morning.  Today was the first bad pee day since first session.  Had lower abdominal pain 3-4/10 just before having to pee. States the relaxation is helping.   Patient Stated Goals does not want to effect work and get rid of the pain.   Currently in Pain? No/denies                         [facility] Adult PT Treatment/Exercise - 11/27/16 0001      Lumbar Exercises: Stretches   Active Hamstring Stretch 3 reps;20 seconds   Single Knee to Chest Stretch 5 reps;10 seconds   Lower Trunk Rotation 3 reps;10 seconds   Piriformis Stretch 3 reps;20 seconds     Knee/Hip Exercises: Stretches   Piriformis Stretch Left;Both;3 reps;10 seconds  sitting and supine   Other Knee/Hip Stretches glute stretch supine, squat adductor and  pelvic floor release, adductor stretch     Manual Therapy   Manual Therapy Soft tissue mobilization   Soft tissue mobilization lumbar paraspinals, QL, glutes, hamstrings                PT Education - 11/26/16 1551    Education provided Yes   Education Details pelvic floor stretches   Person(s) Educated Patient   Methods Explanation;Demonstration;Handout   Comprehension Verbalized understanding;Returned demonstration          PT Short Term Goals - 11/24/16 1632      PT SHORT TERM GOAL #1   Title independent with initial HEP   Time 4   Period Weeks   Status New     PT SHORT TERM GOAL #2   Title pain reduced by 50%   Time 4   Period Weeks   Status New     PT SHORT TERM GOAL #3   Title Reduced voiding to 1x/hour on a bad day for improved function at work   Time 4   Period Weeks   Status New           PT Long Term Goals - 11/24/16 1634      PT LONG TERM GOAL #1   Title FOTO < or = to 29% limitation on urinary problems survey  Baseline 38%   Time 16   Period Weeks   Status New     PT LONG TERM GOAL #2   Title Pt able to effectively use pelvic floor contractions and relaxation techniques to supress the urge to void   Time 16   Period Weeks   Status New     PT LONG TERM GOAL #3   Title improve pelvic floor soft tissue relaxation for reduction in pain by 75%   Time 16   Period Weeks   Status New     PT LONG TERM GOAL #4   Title pt able to void 1x every 2hours at work for improved function and decreased distractions   Time 16   Period Weeks   Status New               Plan - 11/26/16 1612    Clinical Impression Statement Pt able to perform exercises in order to reduce urge to void, but was more stressed at work today so had more frequency. Tension in Lt low back and h/s more than Rt.  Able to perform stretches for pelvic floor relaxation.  Pt continues to need skilled PT for reduced.   Rehab Potential Excellent   Clinical Impairments  Affecting Rehab Potential none   PT Frequency 2x / week   PT Duration Other (comment)   PT Treatment/Interventions ADLs/Self Care Home Management;Biofeedback;Cryotherapy;Electrical Stimulation;Moist Heat;Therapeutic activities;Therapeutic exercise;Neuromuscular re-education;Patient/family education;Manual techniques   PT Next Visit Plan abdominal massage and STM to Lt hamstring and glutes to re-assess, hip flexors, review stretches, review abdominal massage   Consulted and Agree with Plan of Care Patient      Patient will benefit from skilled therapeutic intervention in order to improve the following deficits and impairments:  Decreased coordination, Decreased strength, Pain, Increased muscle spasms  Visit Diagnosis: Other muscle spasm  Muscle weakness (generalized)  Unspecified lack of coordination     Problem List There are no active problems to display for this patient.   [name], PT 11/27/2016, 8:13 AM  [facility] [address], [address] [place], [place], [address] Phone: [phone]   Fax:  [phone]  Name: Patrick Barr MRN: [id number] Date of Birth: [birth year]

## 2016-11-28 ENCOUNTER — Ambulatory Visit: Payer: Managed Care, Other (non HMO) | Attending: Adult Health | Admitting: Physical Therapy

## 2016-11-28 DIAGNOSIS — M62838 Other muscle spasm: Secondary | ICD-10-CM | POA: Diagnosis not present

## 2016-11-28 DIAGNOSIS — R279 Unspecified lack of coordination: Secondary | ICD-10-CM | POA: Diagnosis present

## 2016-11-28 DIAGNOSIS — M6281 Muscle weakness (generalized): Secondary | ICD-10-CM | POA: Diagnosis present

## 2016-11-29 ENCOUNTER — Encounter: Payer: Self-pay | Admitting: Physical Therapy

## 2016-11-29 NOTE — Therapy (Signed)
[name] [address] [place], [place], [address] Phone: [phone]   Fax:  [phone]  Physical Therapy Treatment  Patient Details  Name: Patrick Barr MRN: [id number] Date of Birth: 1966/05/11 Referring Provider: Jetta Lout, NP  Encounter Date: 11/28/2016      PT End of Session - 11/29/16 1935    Visit Number 3   Date for PT Re-Evaluation 03/16/17   PT Start Time 1530   PT Stop Time 1612   PT Time Calculation (min) 42 min   Activity Tolerance Patient tolerated treatment well   Behavior During Therapy [name] for tasks assessed/performed      Past Medical History:  Diagnosis Date  . Arthritis   . Frequency of urination   . History of kidney stones   . Urgency of urination     Past Surgical History:  Procedure Laterality Date  . EXTRACORPOREAL SHOCK WAVE LITHOTRIPSY  2006  . VASECTOMY Bilateral 05/15/2016   Procedure: VASECTOMY;  Surgeon: Sebastian Ache, MD;  Location: [facility];  Service: Urology;  Laterality: Bilateral;    There were no vitals filed for this visit.      Subjective Assessment - 11/29/16 1022    Subjective Reports this afternoon was bad, peed 5 times since 2pm and had pain prior to having to pee in the abdomen.   Currently in Pain? No/denies                         [name] Treatment/Exercise - 11/29/16 0001      Neuro Re-ed    Neuro Re-ed Details  diaphagmatic breathing with emphasis on ribcage excursion and collapse     Manual Therapy   Manual Therapy Soft tissue mobilization   Soft tissue mobilization lumbar paraspinals, QL, glutes, hamstrings                PT Education - 11/29/16 1932    Education provided Yes   Education Details diaphragmatic breathing   Person(s) Educated Patient   Methods Explanation;Demonstration;Tactile cues;Verbal cues   Comprehension Verbalized understanding;Returned demonstration           PT Short Term Goals - 11/29/16 1937      PT SHORT TERM GOAL #1   Title independent with initial HEP   Time 4   Period Weeks   Status On-going     PT SHORT TERM GOAL #2   Title pain reduced by 50%   Time 4   Period Weeks   Status On-going     PT SHORT TERM GOAL #3   Title Reduced voiding to 1x/hour on a bad day for improved function at work   Time 4   Period Weeks   Status On-going           PT Long Term Goals - 11/24/16 1634      PT LONG TERM GOAL #1   Title FOTO < or = to 29% limitation on urinary problems survey   Baseline 38%   Time 16   Period Weeks   Status New     PT LONG TERM GOAL #2   Title Pt able to effectively use pelvic floor contractions and relaxation techniques to supress the urge to void   Time 16   Period Weeks   Status New     PT LONG TERM GOAL #3   Title improve pelvic floor soft tissue relaxation for reduction in pain by 75%  Time 16   Period Weeks   Status New     PT LONG TERM GOAL #4   Title pt able to void 1x every 2hours at work for improved function and decreased distractions   Time 16   Period Weeks   Status New               Plan - 11/29/16 1939    Clinical Impression Statement Pt demonstrates very little ribcage movement, but responded well to verbal and tactile cues.  Lumbar paraspinals and QL very tight.  Pt has been doing stretches.  Continue to need skilled PT for improved pelvic floor muscle control in order to improve bladder control.     Rehab Potential Excellent   Clinical Impairments Affecting Rehab Potential none   PT Frequency 2x / week   PT Duration Other (comment)   PT Treatment/Interventions ADLs/Self Care Home Management;Biofeedback;Cryotherapy;Electrical Stimulation;Moist Heat;Therapeutic activities;Therapeutic exercise;Neuromuscular re-education;Patient/family education;Manual techniques   PT Home Exercise Plan review diaphragmatic breathing, STM to obdurators, levators, coccygeus, bulbospongeosis    Consulted and Agree with Plan of Care Patient      Patient will benefit from skilled therapeutic intervention in order to improve the following deficits and impairments:  Decreased coordination, Decreased strength, Pain, Increased muscle spasms  Visit Diagnosis: Other muscle spasm  Muscle weakness (generalized)  Unspecified lack of coordination     Problem List There are no active problems to display for this patient.   [name], PT 11/29/2016, 8:07 PM  [name] [address], [address] [place], [place], [address] Phone: [phone]   Fax:  [phone]  Name: Patrick Barr MRN: [id number] Date of Birth: 1966-03-31

## 2016-12-03 ENCOUNTER — Encounter: Payer: Self-pay | Admitting: Physical Therapy

## 2016-12-03 ENCOUNTER — Ambulatory Visit: Payer: Managed Care, Other (non HMO) | Admitting: Physical Therapy

## 2016-12-03 DIAGNOSIS — R279 Unspecified lack of coordination: Secondary | ICD-10-CM

## 2016-12-03 DIAGNOSIS — M62838 Other muscle spasm: Secondary | ICD-10-CM | POA: Diagnosis not present

## 2016-12-03 DIAGNOSIS — M6281 Muscle weakness (generalized): Secondary | ICD-10-CM

## 2016-12-03 NOTE — Therapy (Signed)
[facility] [address] [place], [place], [address] Phone: [phone]   Fax:  [phone]  Physical Therapy Treatment  Patient Details  Name: Patrick Barr MRN: [id number] Date of Birth: 03-25-1966 Referring Provider: Jetta Lout, NP  Encounter Date: 12/03/2016      PT End of Session - 12/03/16 1536    Visit Number 4   Date for PT Re-Evaluation 03/16/17   PT Start Time 1536   PT Stop Time 1622   PT Time Calculation (min) 46 min   Activity Tolerance Patient tolerated treatment well   Behavior During Therapy [facility] for tasks assessed/performed      Past Medical History:  Diagnosis Date  . Arthritis   . Frequency of urination   . History of kidney stones   . Urgency of urination     Past Surgical History:  Procedure Laterality Date  . EXTRACORPOREAL SHOCK WAVE LITHOTRIPSY  2006  . VASECTOMY Bilateral 05/15/2016   Procedure: VASECTOMY;  Surgeon: Sebastian Ache, MD;  Location: [facility];  Service: Urology;  Laterality: Bilateral;    There were no vitals filed for this visit.      Subjective Assessment - 12/03/16 1536    Subjective Has been having not too many problems, going about 10x times.  Drinking 4 x 32.  Mild abdominal pain   Patient Stated Goals does not want to effect work and get rid of the pain.   Currently in Pain? No/denies                         [facility] Adult PT Treatment/Exercise - 12/03/16 0001      Neuro Re-ed    Neuro Re-ed Details  diaphragmatic breathing. sitting on the ball, supine with pelvic tilt focus on ribcage movement     Manual Therapy   Soft tissue mobilization lumbar paraspinals, QL, thoracic paraspinals                  PT Short Term Goals - 12/03/16 1712      PT SHORT TERM GOAL #1   Title independent with initial HEP   Time 4   Period Weeks   Status Achieved     PT SHORT TERM GOAL #2   Title pain reduced by 50%   Time 4   Period Weeks   Status On-going     PT SHORT TERM GOAL #3   Title Reduced voiding to 1x/hour on a bad day for improved function at work   Time 4   Period Weeks   Status On-going           PT Long Term Goals - 12/03/16 1712      PT LONG TERM GOAL #1   Title FOTO < or = to 29% limitation on urinary problems survey   Baseline 38%   Time 16   Period Weeks   Status On-going     PT LONG TERM GOAL #2   Title Pt able to effectively use pelvic floor contractions and relaxation techniques to supress the urge to void   Time 16   Period Weeks   Status On-going     PT LONG TERM GOAL #3   Title improve pelvic floor soft tissue relaxation for reduction in pain by 75%   Time 16   Period Weeks   Status On-going     PT LONG TERM GOAL #4   Title pt able to void  1x every 2hours at work for improved function and decreased distractions   Time 16   Period Weeks   Status On-going               Plan - 12/03/16 1713    Clinical Impression Statement Pt had reduced symptoms since previous visit.  Continues to have very little ribcage movement and thoracic extensors and thoracic spine very hypomobile.  Skilled PT to address functional mobility and muscle coordination of core, thoracic and pelvic diaphragms for improved function.   Rehab Potential Excellent   Clinical Impairments Affecting Rehab Potential none   PT Frequency 2x / week   PT Duration Other (comment)   PT Treatment/Interventions ADLs/Self Care Home Management;Biofeedback;Cryotherapy;Electrical Stimulation;Moist Heat;Therapeutic activities;Therapeutic exercise;Neuromuscular re-education;Patient/family education;Manual techniques   PT Next Visit Plan manual to thoracic spine and improve breathing with abdominal muscle coordination   Consulted and Agree with Plan of Care Patient      Patient will benefit from skilled therapeutic intervention in order to improve the following deficits and impairments:  Decreased  coordination, Decreased strength, Pain, Increased muscle spasms  Visit Diagnosis: Other muscle spasm  Muscle weakness (generalized)  Unspecified lack of coordination     Problem List There are no active problems to display for this patient.   [name], PT 12/03/2016, 5:18 PM  [facility] [address], [address] [place], [place], [address] Phone: [phone]   Fax:  [phone]  Name: [place]had A Speelman MRN: [id number] Date of Birth: Apr 09, 1966

## 2016-12-05 ENCOUNTER — Encounter: Payer: Self-pay | Admitting: Physical Therapy

## 2016-12-05 ENCOUNTER — Ambulatory Visit: Payer: Managed Care, Other (non HMO) | Admitting: Physical Therapy

## 2016-12-05 DIAGNOSIS — R279 Unspecified lack of coordination: Secondary | ICD-10-CM

## 2016-12-05 DIAGNOSIS — M62838 Other muscle spasm: Secondary | ICD-10-CM | POA: Diagnosis not present

## 2016-12-05 DIAGNOSIS — M6281 Muscle weakness (generalized): Secondary | ICD-10-CM

## 2016-12-05 NOTE — Therapy (Signed)
[facility] [address], [address] [place], [place], [address] Phone: [phone]   Fax:  [phone]  Physical Therapy Treatment  Patient Details  Name: Patrick Barr MRN: [id number] Date of Birth: Apr 14, 1966 Referring Provider: Jetta Loutiane Warden, NP  Encounter Date: 12/05/2016      PT End of Session - 12/05/16 1544    Visit Number 5   Date for PT Re-Evaluation 03/16/17   PT Start Time 1537   PT Stop Time 1614   PT Time Calculation (min) 37 min   Activity Tolerance Patient tolerated treatment well   Behavior During Therapy [facility] for tasks assessed/performed      Past Medical History:  Diagnosis Date  . Arthritis   . Frequency of urination   . History of kidney stones   . Urgency of urination     Past Surgical History:  Procedure Laterality Date  . EXTRACORPOREAL SHOCK WAVE LITHOTRIPSY  2006  . VASECTOMY Bilateral 05/15/2016   Procedure: VASECTOMY;  Surgeon: Sebastian Acheheodore Manny, MD;  [facility] [place];  Service: Urology;  Laterality: Bilateral;    There were no vitals filed for this visit.      Subjective Assessment - 12/05/16 1539    Subjective Was more stressed so took the day off and focused on stretching.   Patient Stated Goals does not want to effect work and get rid of the pain.   Currently in Pain? No/denies                         [facility] PT Treatment/Exercise - 12/05/16 0001      Neuro Re-ed    Neuro Re-ed Details  practiced bulging for pelvic floor stretch  tactile feedback     Manual Therapy   Manual Therapy Soft tissue mobilization   Manual therapy comments pt was informed and consent given for internal soff tissue mobilization   Soft tissue mobilization bilateral adductors, Rt ischocavernosis, obdurator internus                  PT Short Term Goals - 12/03/16 1712      PT SHORT TERM GOAL #1   Title independent with initial HEP   Time 4   Period  Weeks   Status Achieved     PT SHORT TERM GOAL #2   Title pain reduced by 50%   Time 4   Period Weeks   Status On-going     PT SHORT TERM GOAL #3   Title Reduced voiding to 1x/hour on a bad day for improved function at work   Time 4   Period Weeks   Status On-going           PT Long Term Goals - 12/03/16 1712      PT LONG TERM GOAL #1   Title FOTO < or = to 29% limitation on urinary problems survey   Baseline 38%   Time 16   Period Weeks   Status On-going     PT LONG TERM GOAL #2   Title Pt able to effectively use pelvic floor contractions and relaxation techniques to supress the urge to void   Time 16   Period Weeks   Status On-going     PT LONG TERM GOAL #3   Title improve pelvic floor soft tissue relaxation for reduction in pain by 75%   Time 16   Period Weeks   Status On-going  PT LONG TERM GOAL #4   Title pt able to void 1x every 2hours at work for improved function and decreased distractions   Time 16   Period Weeks   Status On-going               Plan - 12/05/16 1545    Clinical Impression Statement Pt very tight obdurator internus on Rt and Lt side.  Focused more on Rt side which was also where adductors were tight.  Pt demonstrates reduced symptoms with reduced stress. Pt needs skilled PT to understand how to relax pelvic floor for improved function at work.   Rehab Potential Excellent   Clinical Impairments Affecting Rehab Potential none   PT Frequency 2x / week   PT Duration Other (comment)   PT Treatment/Interventions ADLs/Self Care Home Management;Biofeedback;Cryotherapy;Electrical Stimulation;Moist Heat;Therapeutic activities;Therapeutic exercise;Neuromuscular re-education;Patient/family education;Manual techniques   PT Next Visit Plan biofeedback to assess how to relax pelvic floor; manual to thoracic spine, adductors, pelvic floor; bulging, rolling on ball, foam rolling   PT Home Exercise Plan progress as needed   Consulted and Agree  with Plan of Care Patient      Patient will benefit from skilled therapeutic intervention in order to improve the following deficits and impairments:  Decreased coordination, Decreased strength, Pain, Increased muscle spasms  Visit Diagnosis: Other muscle spasm  Muscle weakness (generalized)  Unspecified lack of coordination     Problem List There are no active problems to display for this patient.   [name], PT 12/05/2016, 5:29 PM  [facility] [address] [name], [place], [address] Phone: [phone]   Fax:  [phone]  Name: Patrick Barr MRN: [id number] Date of Birth: [birth year]

## 2016-12-10 ENCOUNTER — Encounter: Payer: Self-pay | Admitting: Physical Therapy

## 2016-12-10 ENCOUNTER — Ambulatory Visit: Payer: Managed Care, Other (non HMO) | Admitting: Physical Therapy

## 2016-12-10 DIAGNOSIS — M6281 Muscle weakness (generalized): Secondary | ICD-10-CM

## 2016-12-10 DIAGNOSIS — M62838 Other muscle spasm: Secondary | ICD-10-CM

## 2016-12-10 DIAGNOSIS — R279 Unspecified lack of coordination: Secondary | ICD-10-CM

## 2016-12-10 NOTE — Patient Instructions (Signed)
Arm / Leg Extension: Alternate (All-Fours)    Raise right arm and opposite leg. Do not arch neck. Do arms only for now, breathing, and as you exhale draw belly button towards the spine and feel like you are hugging the spine using abdominals Repeat __10__ times per set. Do _1___ sets per session. Do ____ sessions per day.  http://orth.exer.us/110   Copyright  VHI. All rights reserved.

## 2016-12-10 NOTE — Therapy (Signed)
[facility] [address] [place], [place], [address] Phone: [phone]   Fax:  [phone]  Physical Therapy Treatment  Patient Details  Name: Patrick Barr MRN: [id number] Date of Birth: 03-20-1966 Referring Provider: Jetta Lout, NP  Encounter Date: 12/10/2016      PT End of Session - 12/10/16 1628    Visit Number 6   Date for PT Re-Evaluation 03/16/17   PT Start Time 1537   PT Stop Time 1618   PT Time Calculation (min) 41 min   Activity Tolerance Patient tolerated treatment well   Behavior During Therapy [facility] for tasks assessed/performed      Past Medical History:  Diagnosis Date  . Arthritis   . Frequency of urination   . History of kidney stones   . Urgency of urination     Past Surgical History:  Procedure Laterality Date  . EXTRACORPOREAL SHOCK WAVE LITHOTRIPSY  2006  . VASECTOMY Bilateral 05/15/2016   Procedure: VASECTOMY;  Surgeon: Sebastian Ache, MD;  Location: [facility];  Service: Urology;  Laterality: Bilateral;    There were no vitals filed for this visit.      Subjective Assessment - 12/10/16 1541    Subjective Pt states he hasn't had any problems since last visit. feels like he is getting better at relaxing, has been doing guided relaxation at home on xBox.    Patient Stated Goals does not want to effect work and get rid of the pain.   Currently in Pain? No/denies                      Pelvic Floor Special Questions - 12/10/16 0001    Biofeedback assessment: relax 60 sec, 32.36mV; quick contract 38.1 mV; 10 sec hold, 35.04 mV; 20 sec hold, 33.96 mV, post test relax, 32.11 mV           [name] Treatment/Exercise - 12/10/16 0001      Neuro Re-ed    Neuro Re-ed Details  biofeedback stretching and core contractions in quadruped and supine for variations that relax pelvic floor   childs pose, breathing, bulging, qudruped                  PT Education - 12/10/16 1620    Education provided Yes   Education Details quadruped with alternating UE raises with abdominal contraction          PT Short Term Goals - 12/10/16 1642      PT SHORT TERM GOAL #2   Title pain reduced by 50%   Time 4   Period Weeks   Status On-going     PT SHORT TERM GOAL #3   Title Reduced voiding to 1x/hour on a bad day for improved function at work   Time 4   Period Weeks   Status On-going           PT Long Term Goals - 12/03/16 1712      PT LONG TERM GOAL #1   Title FOTO < or = to 29% limitation on urinary problems survey   Baseline 38%   Time 16   Period Weeks   Status On-going     PT LONG TERM GOAL #2   Title Pt able to effectively use pelvic floor contractions and relaxation techniques to supress the urge to void   Time 16   Period Weeks   Status On-going     PT LONG  TERM GOAL #3   Title improve pelvic floor soft tissue relaxation for reduction in pain by 75%   Time 16   Period Weeks   Status On-going     PT LONG TERM GOAL #4   Title pt able to void 1x every 2hours at work for improved function and decreased distractions   Time 16   Period Weeks   Status On-going               Plan - 12/10/16 1643    Clinical Impression Statement Increased tone at 32 mV at rest with external biofeedback sensors.  childs pose and quadruped positions able to relax PF down to 4-6 mV.  Pt has difficulty relaxing after contractions and difficulty getting increased strength due to already high tone in pelvic floor muscles.  Skilled PT needed to length and relax PF muscles for improved function and decreased pain.   Rehab Potential Excellent   Clinical Impairments Affecting Rehab Potential none   PT Frequency 2x / week   PT Duration Other (comment)  16   PT Treatment/Interventions ADLs/Self Care Home Management;Biofeedback;Cryotherapy;Electrical Stimulation;Moist Heat;Therapeutic activities;Therapeutic exercise;Neuromuscular  re-education;Patient/family education;Manual techniques   PT Next Visit Plan STM to lengthen pelvic floor muscle, review quadruped and childs pose if needed   Consulted and Agree with Plan of Care Patient      Patient will benefit from skilled therapeutic intervention in order to improve the following deficits and impairments:  Decreased coordination, Decreased strength, Pain, Increased muscle spasms  Visit Diagnosis: Other muscle spasm  Muscle weakness (generalized)  Unspecified lack of coordination     Problem List There are no active problems to display for this patient.   [name], PT 12/10/2016, 4:48 PM  [facility] [address], [address] [place], [place], [address] Phone: [phone]   Fax:  [phone]  Name: Patrick Barr MRN: [id number] Date of Birth: [birth year]

## 2016-12-12 ENCOUNTER — Ambulatory Visit: Payer: Managed Care, Other (non HMO) | Admitting: Physical Therapy

## 2016-12-12 ENCOUNTER — Encounter: Payer: Self-pay | Admitting: Physical Therapy

## 2016-12-12 DIAGNOSIS — R279 Unspecified lack of coordination: Secondary | ICD-10-CM

## 2016-12-12 DIAGNOSIS — M62838 Other muscle spasm: Secondary | ICD-10-CM

## 2016-12-12 DIAGNOSIS — M6281 Muscle weakness (generalized): Secondary | ICD-10-CM

## 2016-12-12 NOTE — Patient Instructions (Signed)
Back Hyperextension: Using Arms    Lying face down with arms bent, inhale. Then while exhaling, straighten arms. Hold __5__ seconds. Slowly return to starting position. Repeat __10__ times per set. Do ___1_ sets per session. Do __1__ sessions per day.  Copyright  VHI. All rights reserved.   Cat / Cow Flow    Inhale, press spine toward ceiling like a Halloween cat. Keeping strength in arms and abdominals, exhale to soften spine through neutral and into cow pose. Open chest and arch back. Initiate movement between cat and cow at tailbone, one vertebrae at a time. Repeat __10__ times.  Copyright  VHI. All rights reserved.   [facility] [address], [address] [place], [place] [address] Phone # [phone] Fax [phone]

## 2016-12-12 NOTE — Therapy (Signed)
[facility] [address], [address] [place], [place], [address] Phone: [phone]   Fax:  [phone]  Physical Therapy Treatment  Patient Details  Name: Patrick Barr MRN: [id number] Date of Birth: Oct 09, 1966 Referring Provider: Jetta Lout, NP  Encounter Date: 12/12/2016      PT End of Session - 12/12/16 1541    Visit Number 7   Date for PT Re-Evaluation 03/16/17   PT Start Time 1535   PT Stop Time 1614   PT Time Calculation (min) 39 min   Activity Tolerance Patient tolerated treatment well   Behavior During Therapy [facility] for tasks assessed/performed      Past Medical History:  Diagnosis Date  . Arthritis   . Frequency of urination   . History of kidney stones   . Urgency of urination     Past Surgical History:  Procedure Laterality Date  . EXTRACORPOREAL SHOCK WAVE LITHOTRIPSY  2006  . VASECTOMY Bilateral 05/15/2016   Procedure: VASECTOMY;  Surgeon: Sebastian Ache, MD;  Location[facility];  Service: Urology;  Laterality: Bilateral;    There were no vitals filed for this visit.      Subjective Assessment - 12/12/16 1536    Subjective States there were three hours that were really tough today with urgency, but managed a little better with the relaxation, but wasn't sure what caused it initially.   Currently in Pain? No/denies                         [name] Treatment/Exercise - 12/12/16 0001      Knee/Hip Exercises: Stretches   Other Knee/Hip Stretches prone press ups and cat cow - educated and performed - 10x each     Manual Therapy   Manual therapy comments prone with pillows under hips   Soft tissue mobilization lumbar paraspinals, QL, glutes, hamstrings                PT Education - 12/12/16 1742    Education provided Yes   Education Details lumbar prone press ups, cat cow   Person(s) Educated Patient   Methods Explanation;Handout;Verbal  cues;Tactile cues   Comprehension Verbalized understanding          PT Short Term Goals - 12/10/16 1642      PT SHORT TERM GOAL #2   Title pain reduced by 50%   Time 4   Period Weeks   Status On-going     PT SHORT TERM GOAL #3   Title Reduced voiding to 1x/hour on a bad day for improved function at work   Time 4   Period Weeks   Status On-going           PT Long Term Goals - 12/03/16 1712      PT LONG TERM GOAL #1   Title FOTO < or = to 29% limitation on urinary problems survey   Baseline 38%   Time 16   Period Weeks   Status On-going     PT LONG TERM GOAL #2   Title Pt able to effectively use pelvic floor contractions and relaxation techniques to supress the urge to void   Time 16   Period Weeks   Status On-going     PT LONG TERM GOAL #3   Title improve pelvic floor soft tissue relaxation for reduction in pain by 75%   Time 16   Period Weeks   Status On-going  PT LONG TERM GOAL #4   Title pt able to void 1x every 2hours at work for improved function and decreased distractions   Time 16   Period Weeks   Status On-going               Plan - 12/12/16 1541    Clinical Impression Statement Unable to perform internal soft tissue release due to patient refusing.  Pt medial hamstrings spasming bilaterally and piriformis on Rt side very tight. Pt able to perform stretches for increased lumbar extension and felt a good stretch.  Skilled PT needed for increased muscle length and pelvic floor ability to relax for reduced symptoms.   Rehab Potential Excellent   Clinical Impairments Affecting Rehab Potential none   PT Frequency 2x / week   PT Duration Other (comment)   PT Treatment/Interventions ADLs/Self Care Home Management;Biofeedback;Cryotherapy;Electrical Stimulation;Moist Heat;Therapeutic activities;Therapeutic exercise;Neuromuscular re-education;Patient/family education;Manual techniques   PT Next Visit Plan STM to lengthen pelvic floor muscle, review  quadruped, childs pose, cat cow, prone ext   Consulted and Agree with Plan of Care Patient      Patient will benefit from skilled therapeutic intervention in order to improve the following deficits and impairments:  Decreased coordination, Decreased strength, Pain, Increased muscle spasms  Visit Diagnosis: Other muscle spasm  Muscle weakness (generalized)  Unspecified lack of coordination     Problem List There are no active problems to display for this patient.   [name], PT 12/12/2016, 5:51 PM  [facility] [address], [address] [place], [place], [address] Phone: [phone]   Fax:  [phone]  Name: Patrick Barr MRN: [id number] Date of Birth: 12-09-1965

## 2016-12-16 ENCOUNTER — Encounter: Payer: Managed Care, Other (non HMO) | Admitting: Physical Therapy

## 2016-12-17 ENCOUNTER — Encounter: Payer: Managed Care, Other (non HMO) | Admitting: Physical Therapy

## 2016-12-18 ENCOUNTER — Ambulatory Visit: Payer: Managed Care, Other (non HMO) | Admitting: Physical Therapy

## 2016-12-18 ENCOUNTER — Encounter: Payer: Self-pay | Admitting: Physical Therapy

## 2016-12-18 DIAGNOSIS — M62838 Other muscle spasm: Secondary | ICD-10-CM

## 2016-12-18 DIAGNOSIS — R279 Unspecified lack of coordination: Secondary | ICD-10-CM

## 2016-12-18 DIAGNOSIS — M6281 Muscle weakness (generalized): Secondary | ICD-10-CM

## 2016-12-18 NOTE — Therapy (Signed)
[facility] [address] [place], [place], [address] Phone: [phone]   Fax:  [phone]  Physical Therapy Treatment  Patient Details  Name: Patrick Barr MRN: [id number] Date of Birth: Oct 07, 1966 Referring Provider: Jetta Lout, NP  Encounter Date: 12/18/2016      PT End of Session - 12/18/16 1656    Visit Number 8   Date for PT Re-Evaluation 03/16/17   PT Start Time 1530   PT Stop Time 1615   PT Time Calculation (min) 45 min   Activity Tolerance Patient tolerated treatment well   Behavior During Therapy [facility] for tasks assessed/performed      Past Medical History:  Diagnosis Date  . Arthritis   . Frequency of urination   . History of kidney stones   . Urgency of urination     Past Surgical History:  Procedure Laterality Date  . EXTRACORPOREAL SHOCK WAVE LITHOTRIPSY  2006  . VASECTOMY Bilateral 05/15/2016   Procedure: VASECTOMY;  Surgeon: Sebastian Ache, MD;  Location: [facility];  Service: Urology;  Laterality: Bilateral;    There were no vitals filed for this visit.      Subjective Assessment - 12/18/16 1537    Subjective No changes with the pelvic floor. Last friday morning and Tuesday afternoon, I had pain with urgency every 15 minutes.    Limitations Other (comment)   Patient Stated Goals does not want to effect work and get rid of the pain.   Currently in Pain? Yes   Pain Score 6    Pain Location Abdomen   Pain Orientation Lower   Pain Descriptors / Indicators Aching;Cramping;Stabbing  quick and brief   Pain Type Chronic pain   Pain Onset More than a month ago   Pain Frequency Intermittent   Aggravating Factors  having an urgeny   Pain Relieving Factors heat   Effect of Pain on Daily Activities effects ability to get work done   Multiple Pain Sites No            [facility] PT Assessment - 12/18/16 0001      Palpation   SI assessment  left ilium is rotated  posteriolry; sacrum rotated left                     [name] Treatment/Exercise - 12/18/16 0001      Manual Therapy   Manual Therapy Soft tissue mobilization;Joint mobilization;Muscle Energy Technique;Myofascial release   Manual therapy comments P-A and rotational mobilization grade 3 to T6-L5; Sacral rotational mobilization in prone  passively stretched bilateral hips   Soft tissue mobilization left levator ani and obturator internist   Myofascial Release release of the urogenital diaphgram with one hand on ant. and other post. going through the 3 planes of fascia; release of the bladder    Muscle Energy Technique to correct left posteriorly rotated ilium                PT Education - 12/18/16 1656    Education provided No          PT Short Term Goals - 12/10/16 1642      PT SHORT TERM GOAL #2   Title pain reduced by 50%   Time 4   Period Weeks   Status On-going     PT SHORT TERM GOAL #3   Title Reduced voiding to 1x/hour on a bad day for improved function at work   Time 4  Period Weeks   Status On-going           PT Long Term Goals - 12/18/16 1540      PT LONG TERM GOAL #1   Title FOTO < or = to 29% limitation on urinary problems survey   Baseline 38%   Time 16   Period Weeks   Status On-going     PT LONG TERM GOAL #2   Title Pt able to effectively use pelvic floor contractions and relaxation techniques to supress the urge to void   Time 16   Period Weeks   Status Achieved  good for 15 minutes     PT LONG TERM GOAL #3   Title improve pelvic floor soft tissue relaxation for reduction in pain by 75%   Time 16   Period Weeks   Status On-going     PT LONG TERM GOAL #4   Title pt able to void 1x every 2hours at work for improved function and decreased distractions   Time 16   Period Weeks   Status On-going               Plan - 12/18/16 1656    Clinical Impression Statement Patient is able to use the relaxation  techniques so he is able to decrease the urge to urinate for 15 minutes.  Patient is not on a walking program but therapist has emphasized a walking program.  After therapy patient had increased bilateral hip external rotation and flexion.  Patient felt loser after therapy.  Patient will benefit from physical therapy to increase muscle length and pelvic floor ability to relax for reduced symptoms.    Rehab Potential Excellent   Clinical Impairments Affecting Rehab Potential none   PT Frequency 2x / week   PT Duration Other (comment)   PT Treatment/Interventions ADLs/Self Care Home Management;Biofeedback;Cryotherapy;Electrical Stimulation;Moist Heat;Therapeutic activities;Therapeutic exercise;Neuromuscular re-education;Patient/family education;Manual techniques   PT Next Visit Plan STM to lengthen pelvic floor muscle, review quadruped, childs pose, cat cow, prone ext   PT Home Exercise Plan progress as needed   Consulted and Agree with Plan of Care Patient      Patient will benefit from skilled therapeutic intervention in order to improve the following deficits and impairments:  Decreased coordination, Decreased strength, Pain, Increased muscle spasms  Visit Diagnosis: Other muscle spasm  Muscle weakness (generalized)  Unspecified lack of coordination     Problem List There are no active problems to display for this patient.   Eulis FosterCheryl , PT 12/18/16 5:00 PM   [facility] [address] [place], [place], [address] Phone: [phone]   Fax:  [phone]  Name: Patrick Barr MRN: [id number] Date of Birth: [birth year]

## 2016-12-24 ENCOUNTER — Encounter: Payer: Managed Care, Other (non HMO) | Admitting: Physical Therapy

## 2016-12-26 ENCOUNTER — Encounter: Payer: Self-pay | Admitting: Physical Therapy

## 2016-12-26 ENCOUNTER — Ambulatory Visit: Payer: Managed Care, Other (non HMO) | Attending: Adult Health | Admitting: Physical Therapy

## 2016-12-26 DIAGNOSIS — M62838 Other muscle spasm: Secondary | ICD-10-CM

## 2016-12-26 DIAGNOSIS — R279 Unspecified lack of coordination: Secondary | ICD-10-CM | POA: Insufficient documentation

## 2016-12-26 DIAGNOSIS — M6281 Muscle weakness (generalized): Secondary | ICD-10-CM | POA: Diagnosis present

## 2016-12-26 NOTE — Therapy (Signed)
[facility] [address], [address] [place], [place], [address] Phone: [phone]   Fax:  [phone]  Physical Therapy Treatment  Patient Details  Name: Patrick Barr MRN: [id number] Date of Birth: 02/27/1966 Referring Provider: Jetta Loutiane Warden, NP  Encounter Date: 12/26/2016      PT End of Session - 12/26/16 1718    Visit Number 9   Date for PT Re-Evaluation 03/16/17   PT Start Time 1537   PT Stop Time 1615   PT Time Calculation (min) 38 min   Activity Tolerance Patient tolerated treatment well   Behavior During Therapy [facility] for tasks assessed/performed      Past Medical History:  Diagnosis Date  . Arthritis   . Frequency of urination   . History of kidney stones   . Urgency of urination     Past Surgical History:  Procedure Laterality Date  . EXTRACORPOREAL SHOCK WAVE LITHOTRIPSY  2006  . VASECTOMY Bilateral 05/15/2016   Procedure: VASECTOMY;  Surgeon: Sebastian Acheheodore Manny, MD;  [facility];  Service: Urology;  Laterality: Bilateral;    There were no vitals filed for this visit.      Subjective Assessment - 12/26/16 1540    Subjective Tuesday was a bad day and had a half day, going every 15 minutes.  Painful when trying to start going. States the pain is 3-5/10 when has it.  Did not have bad days until Tuesday.   Patient Stated Goals does not want to effect work and get rid of the pain.   Currently in Pain? No/denies                         [name] Treatment/Exercise - 12/26/16 0001      Lumbar Exercises: Stretches   Prone on Elbows Stretch Limitations 10x on elbows, then face down on pillow with hip adduction for hip opening stretch     Manual Therapy   Soft tissue mobilization multifidi and QL, left>right levator ani and obturator internist                  PT Short Term Goals - 12/10/16 1642      PT SHORT TERM GOAL #2   Title pain reduced by 50%    Time 4   Period Weeks   Status On-going     PT SHORT TERM GOAL #3   Title Reduced voiding to 1x/hour on a bad day for improved function at work   Time 4   Period Weeks   Status On-going           PT Long Term Goals - 12/18/16 1540      PT LONG TERM GOAL #1   Title FOTO < or = to 29% limitation on urinary problems survey   Baseline 38%   Time 16   Period Weeks   Status On-going     PT LONG TERM GOAL #2   Title Pt able to effectively use pelvic floor contractions and relaxation techniques to supress the urge to void   Time 16   Period Weeks   Status Achieved  good for 15 minutes     PT LONG TERM GOAL #3   Title improve pelvic floor soft tissue relaxation for reduction in pain by 75%   Time 16   Period Weeks   Status On-going     PT LONG TERM GOAL #4   Title pt able to  void 1x every 2hours at work for improved function and decreased distractions   Time 16   Period Weeks   Status On-going               Plan - 12/26/16 1556    Clinical Impression Statement Patient having difficulty relaxing pelvic floor when urinating, reports pain and difficulty initiating a stream.  Patient making progress with increased blood flow to pelvic floor and reports more arousal when doing the stretches.  Continues to need PT to work on muscle spasms for improved function and reduced pain at work.   Rehab Potential Excellent   PT Treatment/Interventions ADLs/Self Care Home Management;Biofeedback;Cryotherapy;Electrical Stimulation;Moist Heat;Therapeutic activities;Therapeutic exercise;Neuromuscular re-education;Patient/family education;Manual techniques   PT Next Visit Plan left sidelying for pelvic floor STM coccygeus and levator release, bulbospongeosis release, check pelvic obliquity   Consulted and Agree with Plan of Care Patient      Patient will benefit from skilled therapeutic intervention in order to improve the following deficits and impairments:  Decreased coordination,  Decreased strength, Pain, Increased muscle spasms  Visit Diagnosis: Other muscle spasm  Muscle weakness (generalized)  Unspecified lack of coordination     Problem List There are no active problems to display for this patient.   [name], PT 12/26/2016, 5:28 PM  [facility] [address], [address] [place], [place], [address] Phone: [phone]   Fax:  [phone]  Name: Patrick Barr MRN: [id number] Date of Birth: 1966-04-12

## 2016-12-31 ENCOUNTER — Encounter: Payer: Managed Care, Other (non HMO) | Admitting: Physical Therapy

## 2017-01-02 ENCOUNTER — Encounter: Payer: Managed Care, Other (non HMO) | Admitting: Physical Therapy

## 2017-01-07 ENCOUNTER — Encounter: Payer: Managed Care, Other (non HMO) | Admitting: Physical Therapy

## 2017-01-09 ENCOUNTER — Ambulatory Visit: Payer: Managed Care, Other (non HMO) | Admitting: Physical Therapy

## 2017-01-09 DIAGNOSIS — R279 Unspecified lack of coordination: Secondary | ICD-10-CM

## 2017-01-09 DIAGNOSIS — M62838 Other muscle spasm: Secondary | ICD-10-CM

## 2017-01-09 DIAGNOSIS — M6281 Muscle weakness (generalized): Secondary | ICD-10-CM

## 2017-01-09 NOTE — Therapy (Signed)
[facility] [address], [address] [place], [place], [address] Phone: [phone]   Fax:  [phone]  Physical Therapy Treatment  Patient Details  Name: Patrick Barr MRN: [id number] Date of Birth: [birth year] Referring Provider: Jetta Loutiane Warden, NP  Encounter Date: 01/09/2017      PT End of Session - 01/09/17 1546    Visit Number 10   Date for PT Re-Evaluation 03/16/17   PT Start Time 1536   PT Stop Time 1620   PT Time Calculation (min) 44 min   Activity Tolerance Patient tolerated treatment well   Behavior During Therapy [facility] for tasks assessed/performed      Past Medical History:  Diagnosis Date  . Arthritis   . Frequency of urination   . History of kidney stones   . Urgency of urination     Past Surgical History:  Procedure Laterality Date  . EXTRACORPOREAL SHOCK WAVE LITHOTRIPSY  2006  . VASECTOMY Bilateral 05/15/2016   Procedure: VASECTOMY;  Surgeon: Sebastian Acheheodore Manny, MD;  [facility];  Service: Urology;  Laterality: Bilateral;    There were no vitals filed for this visit.      Subjective Assessment - 01/09/17 1539    Subjective Had about 2 bad days since last session. Today has been relatively good.  Pain is deep lower abdomen into the groin and feels like 4-6/10 when he has it.     Patient Stated Goals does not want to effect work and get rid of the pain.   Currently in Pain? No/denies                         [name] Treatment/Exercise - 01/09/17 0001      Neuro Re-ed    Neuro Re-ed Details  sitting on physioball - rolling and side bend stretches with breathing     Manual Therapy   Soft tissue mobilization multifidi and QL, left>right levator ani and obturator internist   Myofascial Release release of the urogenital diaphgram with one hand on ant. and other post. going through the 3 planes of fascia; release of the bladder                    PT Short Term Goals - 12/10/16 1642      PT SHORT TERM GOAL #2   Title pain reduced by 50%   Time 4   Period Weeks   Status On-going     PT SHORT TERM GOAL #3   Title Reduced voiding to 1x/hour on a bad day for improved function at work   Time 4   Period Weeks   Status On-going           PT Long Term Goals - 12/18/16 1540      PT LONG TERM GOAL #1   Title FOTO < or = to 29% limitation on urinary problems survey   Baseline 38%   Time 16   Period Weeks   Status On-going     PT LONG TERM GOAL #2   Title Pt able to effectively use pelvic floor contractions and relaxation techniques to supress the urge to void   Time 16   Period Weeks   Status Achieved  good for 15 minutes     PT LONG TERM GOAL #3   Title improve pelvic floor soft tissue relaxation for reduction in pain by 75%   Time 16   Period Weeks  Status On-going     PT LONG TERM GOAL #4   Title pt able to void 1x every 2hours at work for improved function and decreased distractions   Time 16   Period Weeks   Status On-going               Plan - 01/09/17 1702    Clinical Impression Statement Patient had muscle spasms in coccygeus, obdurator internus and anterior attachments of levators left>right.  No pelvic obliquity noticed.  Patient responding well to treatment.  Has increased coccyx flexion and ocntinues to need skilled PT for reduced muscle spasms and improved function.   Rehab Potential Excellent   PT Treatment/Interventions ADLs/Self Care Home Management;Biofeedback;Cryotherapy;Electrical Stimulation;Moist Heat;Therapeutic activities;Therapeutic exercise;Neuromuscular re-education;Patient/family education;Manual techniques   PT Next Visit Plan extension stretches, pelvic floor STM coccygeus and levator release, bulbospongeosis release   Consulted and Agree with Plan of Care Patient      Patient will benefit from skilled therapeutic intervention in order to improve the following deficits and  impairments:  Decreased coordination, Decreased strength, Pain, Increased muscle spasms  Visit Diagnosis: Other muscle spasm  Muscle weakness (generalized)  Unspecified lack of coordination     Problem List There are no active problems to display for this patient.   [name], PT 01/09/2017, 5:20 PM  [facility] [address], [address] [place], [place], [address] Phone: [phone]   Fax:  [phone]  Name: [place] A Kamm MRN: [id number] Date of Birth: [birth year]

## 2017-01-14 ENCOUNTER — Encounter: Payer: Managed Care, Other (non HMO) | Admitting: Physical Therapy

## 2017-01-16 ENCOUNTER — Ambulatory Visit: Payer: Managed Care, Other (non HMO) | Admitting: Physical Therapy

## 2017-01-16 ENCOUNTER — Encounter: Payer: Self-pay | Admitting: Physical Therapy

## 2017-01-16 DIAGNOSIS — R279 Unspecified lack of coordination: Secondary | ICD-10-CM

## 2017-01-16 DIAGNOSIS — M62838 Other muscle spasm: Secondary | ICD-10-CM | POA: Diagnosis not present

## 2017-01-16 DIAGNOSIS — M6281 Muscle weakness (generalized): Secondary | ICD-10-CM

## 2017-01-16 NOTE — Therapy (Signed)
[facility] [address] [place], [place], [address] Phone: [phone]   Fax:  [phone]  Physical Therapy Treatment  Patient Details  Name: Patrick Barr MRN: [id number] Date of Birth: 28-Sep-1966 Referring Provider: Jimmey Ralph, NP  Encounter Date: 01/16/2017      PT End of Session - 01/16/17 1535    Visit Number 11   Date for PT Re-Evaluation 03/16/17   PT Start Time 7510   PT Stop Time 1639   PT Time Calculation (min) 64 min   Activity Tolerance Patient tolerated treatment well   Behavior During Therapy [facility] for tasks assessed/performed      Past Medical History:  Diagnosis Date  . Arthritis   . Frequency of urination   . History of kidney stones   . Urgency of urination     Past Surgical History:  Procedure Laterality Date  . EXTRACORPOREAL SHOCK WAVE LITHOTRIPSY  2006  . VASECTOMY Bilateral 05/15/2016   Procedure: VASECTOMY;  Surgeon: Alexis Frock, MD;  Location: [facility];  Service: Urology;  Laterality: Bilateral;    There were no vitals filed for this visit.      Subjective Assessment - 01/16/17 1703    Subjective Felt good since last session until today.  Pt states pain is less frequent.  Has had episodes of peeing and extremem pain and stream stopped.  Pain is 3-5/10 when he has it.   Patient Stated Goals does not want to effect work and get rid of the pain.   Currently in Pain? No/denies                         [facility] Adult PT Treatment/Exercise - 01/16/17 0001      Self-Care   Self-Care Posture   Posture educated on standing more at work, increased thoracic extension,      Lumbar Exercises: Stretches   Lower Trunk Rotation 5 reps;10 seconds  left side bending     Moist Heat Therapy   Number Minutes Moist Heat 15 Minutes   Moist Heat Location --  abdomen     Electrical Stimulation   Electrical Stimulation Location abdomen   Electrical  Stimulation Action pre-mod   Electrical Stimulation Parameters to tolerance   Electrical Stimulation Goals Pain     Manual Therapy   Manual Therapy Taping   Soft tissue mobilization glutes, multifidi, QL, lumbar and thoracic paraspinals   Muscle Energy Technique to correct left posteriorly rotated ilium   Kinesiotex Inhibit Muscle  for greater coccyx extension                  PT Short Term Goals - 12/10/16 1642      PT SHORT TERM GOAL #2   Title pain reduced by 50%   Time 4   Period Weeks   Status On-going     PT SHORT TERM GOAL #3   Title Reduced voiding to 1x/hour on a bad day for improved function at work   Time 4   Period Weeks   Status On-going           PT Long Term Goals - 01/16/17 1632      PT LONG TERM GOAL #1   Title FOTO < or = to 29% limitation on urinary problems survey   Time 16   Period Weeks   Status On-going     PT LONG TERM GOAL #2   Title Pt able  to effectively use pelvic floor contractions and relaxation techniques to supress the urge to void   Time 16   Period Weeks   Status Achieved     PT LONG TERM GOAL #3   Title improve pelvic floor soft tissue relaxation for reduction in pain by 75%   Time 16   Period Weeks   Status On-going     PT LONG TERM GOAL #4   Title pt able to void 1x every 2hours at work for improved function and decreased distractions   Time 16   Period Weeks   Status On-going               Plan - 01/16/17 1535    Clinical Impression Statement Muscle spasming on Left lumbar and thoracic paraspinals.  Left pelvic obliquity corrected with MET.  Patient continues to have pain .  Had some muscle cramping during stim and heat.  He was educated on drinking more liquids in order to reduce muscle spasms.     Rehab Potential Excellent   PT Treatment/Interventions ADLs/Self Care Home Management;Biofeedback;Cryotherapy;Electrical Stimulation;Moist Heat;Therapeutic activities;Therapeutic exercise;Neuromuscular  re-education;Patient/family education;Manual techniques   PT Next Visit Plan f/u with tape and side bending stretch, STM to bulbospongeosis, modalities as needed   Consulted and Agree with Plan of Care Patient      Patient will benefit from skilled therapeutic intervention in order to improve the following deficits and impairments:  Decreased coordination, Decreased strength, Pain, Increased muscle spasms  Visit Diagnosis: Other muscle spasm  Muscle weakness (generalized)  Unspecified lack of coordination     Problem List There are no active problems to display for this patient.   Zannie Cove, PT 01/16/2017, 5:07 PM  [facility] [address] [place], [place], [address] Phone: [phone]   Fax:  [phone]  Name: Patrick A Fouts MRN: [id number] Date of Birth: 02-17-1966

## 2017-01-21 ENCOUNTER — Encounter: Payer: Managed Care, Other (non HMO) | Admitting: Physical Therapy

## 2017-01-23 ENCOUNTER — Ambulatory Visit: Payer: Managed Care, Other (non HMO) | Admitting: Physical Therapy

## 2017-01-23 DIAGNOSIS — M62838 Other muscle spasm: Secondary | ICD-10-CM | POA: Diagnosis not present

## 2017-01-23 DIAGNOSIS — M6281 Muscle weakness (generalized): Secondary | ICD-10-CM

## 2017-01-23 DIAGNOSIS — R279 Unspecified lack of coordination: Secondary | ICD-10-CM

## 2017-01-23 NOTE — Therapy (Signed)
[facility] [address], [address] [place], [place], [address] Phone: [phone]   Fax:  [phone]  Physical Therapy Treatment  Patient Details  Name: Patrick Barr MRN: [id number] Date of Birth: 10-22-1966 Referring Provider: Jetta Loutiane Warden, NP  Encounter Date: 01/23/2017      PT End of Session - 01/23/17 1731    Visit Number 12   Date for PT Re-Evaluation 03/16/17   PT Start Time 1532   PT Stop Time 1622   PT Time Calculation (min) 50 min   Activity Tolerance Patient tolerated treatment well   Behavior During Therapy [facility] for tasks assessed/performed      Past Medical History:  Diagnosis Date  . Arthritis   . Frequency of urination   . History of kidney stones   . Urgency of urination     Past Surgical History:  Procedure Laterality Date  . EXTRACORPOREAL SHOCK WAVE LITHOTRIPSY  2006  . VASECTOMY Bilateral 05/15/2016   Procedure: VASECTOMY;  Surgeon: Sebastian Acheheodore Manny, MD;  [facility] [place];  Service: Urology;  Laterality: Bilateral;    There were no vitals filed for this visit.      Subjective Assessment - 01/23/17 1552    Subjective Felt like he is noticing the muscles tight.  Pt had some cramping when riding his bike. Did not have a really "bad pee day" this past week.     Patient Stated Goals does not want to effect work and get rid of the pain.                         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, education on ergonomics     Neuro Re-ed    Neuro Re-ed Details  rolling on noodle for muscle relaxation and feedback to pelvic muscle in sitting     Lumbar Exercises: Stretches   Standing Extension 3 reps;20 seconds  hip flexor stretches; with and without lean      Lumbar Exercises: Seated   Other Seated Lumbar Exercises rolling on  noodle for muscle relaxation and feedback     Manual Therapy   Soft tissue mobilization glutes, multifidi, QL, lumbar and thoracic paraspinals   Myofascial Release abdominal and psoas muscle release   Muscle Energy Technique to correct left posteriorly rotated ilium                PT Education - 01/23/17 1731    Education provided Yes   Education Details posture   Person(s) Educated Patient   Methods Explanation   Comprehension Verbalized understanding          PT Short Term Goals - 01/23/17 1733      PT SHORT TERM GOAL #2   Title pain reduced by 50%   Time 4   Period Weeks   Status On-going     PT SHORT TERM GOAL #3   Title Reduced voiding to 1x/hour on a bad day for improved function at work   Time 4   Period Weeks   Status On-going           PT Long Term Goals - 01/16/17 1632      PT LONG TERM GOAL #1   Title FOTO < or = to 29% limitation on urinary problems survey   Time 16  Period Weeks   Status On-going     PT LONG TERM GOAL #2   Title Pt able to effectively use pelvic floor contractions and relaxation techniques to supress the urge to void   Time 16   Period Weeks   Status Achieved     PT LONG TERM GOAL #3   Title improve pelvic floor soft tissue relaxation for reduction in pain by 75%   Time 16   Period Weeks   Status On-going     PT LONG TERM GOAL #4   Title pt able to void 1x every 2hours at work for improved function and decreased distractions   Time 16   Period Weeks   Status On-going               Plan - 01/23/17 1733    Clinical Impression Statement Pt has more body awareness.  He had reduced frequency of missed work days over the last two weeks.  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   Rehab Potential Excellent   PT Treatment/Interventions  ADLs/Self Care Home Management;Biofeedback;Cryotherapy;Electrical Stimulation;Moist Heat;Therapeutic activities;Therapeutic exercise;Neuromuscular re-education;Patient/family education;Manual techniques   PT Next Visit Plan review hip flexor stretch, f/u with taping, rolling noodle on pelvic floor, STM to levator and coccygeus, MFR, pelvic drop, left pelvic obliquity.   PT Home Exercise Plan progress as needed   Consulted and Agree with Plan of Care Patient      Patient will benefit from skilled therapeutic intervention in order to improve the following deficits and impairments:  Decreased coordination, Decreased strength, Pain, Increased muscle spasms  Visit Diagnosis: Other muscle spasm  Muscle weakness (generalized)  Unspecified lack of coordination     Problem List There are no active problems to display for this patient.   [name], PT 01/23/2017, 5:40 PM  [facility] [address] [place], [place], [address] Phone: [phone]   Fax:  [phone]  Name: Patrick Barr MRN: [id number] Date of Birth: [birth year]

## 2017-01-23 NOTE — Patient Instructions (Signed)
Posture - Standing   Good posture is important. Avoid slouching and forward head thrust. Maintain curve in low back and align ears over shoulders, hips over ankles.  Pull your belly button in toward your back bone. Posture Tips DO: - stand tall and erect - keep chin tucked in - keep head and shoulders in alignment - check posture regularly in mirror or large window - pull head back against headrest in car seat;  Change your position often.  Sit with lumbar support. DON'T: - slouch or slump while watching TV or reading - sit, stand or lie in one position  for too long;  Sitting is especially hard on the spine so if you sit at a desk/use the computer, then stand up often! Copyright  VHI. All rights reserved.  Posture - Sitting  Sit upright, head facing forward. Try using a roll to support lower back. Keep shoulders relaxed, and avoid rounded back. Keep hips level with knees. Avoid crossing legs for long periods. Copyright  VHI. All rights reserved.  Chronic neck strain can develop because of poor posture and faulty work habits  Postural strain related to slumped sitting and forward head posture is a leading cause of headaches, neck and upper back pain  General strengthening and flexibility exercises are helpful in the treatment of neck pain.  Most importantly, you should learn to correct the posture that may be contributing to chronic pain.   Change positions frequently  Change your work or home environment to improve posture and mechanics.    

## 2017-02-04 ENCOUNTER — Encounter: Payer: Self-pay | Admitting: Physical Therapy

## 2017-02-04 ENCOUNTER — Ambulatory Visit: Payer: Managed Care, Other (non HMO) | Attending: Adult Health | Admitting: Physical Therapy

## 2017-02-04 DIAGNOSIS — M6281 Muscle weakness (generalized): Secondary | ICD-10-CM | POA: Insufficient documentation

## 2017-02-04 DIAGNOSIS — R279 Unspecified lack of coordination: Secondary | ICD-10-CM | POA: Insufficient documentation

## 2017-02-04 DIAGNOSIS — M62838 Other muscle spasm: Secondary | ICD-10-CM | POA: Diagnosis not present

## 2017-02-04 NOTE — Therapy (Signed)
[facility] [address] [place], [place], [address] Phone: [phone]   Fax:  [phone]  Physical Therapy Treatment  Patient Details  Name: Patrick Barr MRN: [id number] Date of Birth: 26-May-1966 Referring Provider: Jetta Lout, NP  Encounter Date: 02/04/2017      PT End of Session - 02/04/17 1541    Visit Number 13   Date for PT Re-Evaluation 03/16/17   PT Start Time 1535   PT Stop Time 1621   PT Time Calculation (min) 46 min   Activity Tolerance Patient tolerated treatment well   Behavior During Therapy [facility] for tasks assessed/performed      Past Medical History:  Diagnosis Date  . Arthritis   . Frequency of urination   . History of kidney stones   . Urgency of urination     Past Surgical History:  Procedure Laterality Date  . EXTRACORPOREAL SHOCK WAVE LITHOTRIPSY  2006  . VASECTOMY Bilateral 05/15/2016   Procedure: VASECTOMY;  Surgeon: Sebastian Ache, MD;  Location: [facility];  Service: Urology;  Laterality: Bilateral;    There were no vitals filed for this visit.      Subjective Assessment - 02/04/17 1538    Subjective States he rode his bike on the weekend but no pain.  [name] had a lot of pain in abdomen and groin.  Felt better after STM last session.  Had a spasm yesterday when urinating that was painful and slowed down his stream.  Today was normal   Patient Stated Goals does not want to effect work and get rid of the pain.   Currently in Pain? No/denies                         [name] Treatment/Exercise - 02/04/17 0001      Knee/Hip Exercises: Stretches   Other Knee/Hip Stretches foam rolling to glutes, piriformis, adductors - educated and performed     Manual Therapy   Manual Therapy Internal Pelvic Floor   Soft tissue mobilization glutes, multifidi, QL, lumbar and thoracic paraspinals   Internal Pelvic Floor bilateral levator ani,  coccygeus, puborectalis                  PT Short Term Goals - 02/04/17 1541      PT SHORT TERM GOAL #3   Title Reduced voiding to 1x/hour on a bad day for improved function at work   Time 4   Period Weeks   Status Achieved           PT Long Term Goals - 01/16/17 1632      PT LONG TERM GOAL #1   Title FOTO < or = to 29% limitation on urinary problems survey   Time 16   Period Weeks   Status On-going     PT LONG TERM GOAL #2   Title Pt able to effectively use pelvic floor contractions and relaxation techniques to supress the urge to void   Time 16   Period Weeks   Status Achieved     PT LONG TERM GOAL #3   Title improve pelvic floor soft tissue relaxation for reduction in pain by 75%   Time 16   Period Weeks   Status On-going     PT LONG TERM GOAL #4   Title pt able to void 1x every 2hours at work for improved function and decreased distractions   Time 16  Period Weeks   Status On-going               Plan - 02/04/17 1544    Clinical Impression Statement Pt continues to have muscle spasms left>right pelvic floor muscles.  Pt was educated on foam roller and self massage to bulbospongeosis.  Pt has made progress with decrease urinary frequency however pain has stayed about the same.  Pt continues to need skilled PT due to condition being chronic and has been progressing since he was a child. Pt will benefit from skilled PT to work on continued lengthening of pelvic floor muscles and to be able to control ability to relax muscles.   Rehab Potential Excellent   PT Treatment/Interventions ADLs/Self Care Home Management;Biofeedback;Cryotherapy;Electrical Stimulation;Moist Heat;Therapeutic activities;Therapeutic exercise;Neuromuscular re-education;Patient/family education;Manual techniques   PT Next Visit Plan foam rolling to glutes and adductors, f/u with bulbospongeosis self massage, STM to pelvic floor, assess sacral and pelvic alignment   PT Home  Exercise Plan progress as needed   Consulted and Agree with Plan of Care Patient      Patient will benefit from skilled therapeutic intervention in order to improve the following deficits and impairments:  Decreased coordination, Decreased strength, Pain, Increased muscle spasms  Visit Diagnosis: Other muscle spasm  Muscle weakness (generalized)  Unspecified lack of coordination     Problem List There are no active problems to display for this patient.   Patrick Barr, PT 02/04/2017, 5:18 PM  [facility] [address], [address] [place], [place], [address] Phone: [phone]   Fax:  [phone]  Name: Patrick Barr MRN: [id number] Date of Birth: 03/18/1966

## 2017-02-12 ENCOUNTER — Encounter: Payer: Self-pay | Admitting: Physical Therapy

## 2017-02-12 ENCOUNTER — Ambulatory Visit: Payer: Managed Care, Other (non HMO) | Admitting: Physical Therapy

## 2017-02-12 DIAGNOSIS — R279 Unspecified lack of coordination: Secondary | ICD-10-CM

## 2017-02-12 DIAGNOSIS — M62838 Other muscle spasm: Secondary | ICD-10-CM

## 2017-02-12 DIAGNOSIS — M6281 Muscle weakness (generalized): Secondary | ICD-10-CM

## 2017-02-12 NOTE — Therapy (Signed)
[facility] [address] [place], [place], [address] Phone: [phone]   Fax:  [phone]  Physical Therapy Treatment  Patient Details  Name: Patrick Barr MRN: [id number] Date of Birth: February 09, 1966 Referring Provider: Jetta Lout, NP  Encounter Date: 02/12/2017      PT End of Session - 02/12/17 1621    Visit Number 14   Date for PT Re-Evaluation 03/16/17   PT Start Time 1535   PT Stop Time 1620   PT Time Calculation (min) 45 min   Activity Tolerance Patient tolerated treatment well   Behavior During Therapy [facility] for tasks assessed/performed      Past Medical History:  Diagnosis Date  . Arthritis   . Frequency of urination   . History of kidney stones   . Urgency of urination     Past Surgical History:  Procedure Laterality Date  . EXTRACORPOREAL SHOCK WAVE LITHOTRIPSY  2006  . VASECTOMY Bilateral 05/15/2016   Procedure: VASECTOMY;  Surgeon: Sebastian Ache, MD;  Location: [facility];  Service: Urology;  Laterality: Bilateral;    There were no vitals filed for this visit.      Subjective Assessment - 02/12/17 1622    Subjective I had really bad days Saturday and Sunday.  Monday until today has been good, I have been using a maternity pillow when sleeping and that seems like it may be helping.   Patient Stated Goals does not want to effect work and get rid of the pain.   Currently in Pain? No/denies                         [name] Treatment/Exercise - 02/12/17 0001      Lumbar Exercises: Stretches   Quadruped Mid Back Stretch 5 reps  cat cow     Knee/Hip Exercises: Stretches   Hip Flexor Stretch 3 reps;20 seconds  kneeling and standing   Other Knee/Hip Stretches foam rolling to glutes, piriformis, adductors - educated and performed   Other Knee/Hip Stretches hip internal rotation     Manual Therapy   Manual Therapy Myofascial release;Passive ROM;Joint  mobilization   Joint Mobilization distraction, A/P mobs   Myofascial Release abdominal and bilateral inguinal release   Passive ROM left hip flexion abduction, rotation both ways                PT Education - 02/12/17 1620    Education provided Yes   Education Details hip extension and internal rotation stretches, cow stretch in table top (reduced HEP to these for now)   Person(s) Educated Patient   Methods Explanation;Demonstration;Verbal cues;Handout   Comprehension Verbalized understanding;Returned demonstration          PT Short Term Goals - 02/12/17 1648      PT SHORT TERM GOAL #2   Title pain reduced by 50%   Time 4   Period Weeks   Status On-going           PT Long Term Goals - 02/12/17 1648      PT LONG TERM GOAL #1   Title FOTO < or = to 29% limitation on urinary problems survey   Time 16   Period Weeks   Status On-going     PT LONG TERM GOAL #3   Title improve pelvic floor soft tissue relaxation for reduction in pain by 75%   Time 16   Period Weeks   Status On-going  PT LONG TERM GOAL #4   Title pt able to void 1x every 1 hours at work for improved function and decreased distractions   Time 16   Period Weeks   Status Revised               Plan - 02/12/17 1636    Clinical Impression Statement Pt still having left side anterior groin pain and pain in lower abdomen.  Pt had myofascial tension in left inguinal region and hip internal rotation tightness bilaterally.  No pain with hip PROM all directions, no pain with distraction or GH joint compression.  Reviewed hip flexor and internal rotation stretches and foam rolling on glutes and responded well.  Pt will continue to benefit from skilled PT for improved function and ability to manage pain.   Rehab Potential Excellent   PT Treatment/Interventions ADLs/Self Care Home Management;Biofeedback;Cryotherapy;Electrical Stimulation;Moist Heat;Therapeutic activities;Therapeutic  exercise;Neuromuscular re-education;Patient/family education;Manual techniques   PT Next Visit Plan nervous system calming meditation, food diary (to see if any intestinal issues causing pain),    Consulted and Agree with Plan of Care Patient      Patient will benefit from skilled therapeutic intervention in order to improve the following deficits and impairments:  Decreased coordination, Decreased strength, Pain, Increased muscle spasms  Visit Diagnosis: Other muscle spasm  Muscle weakness (generalized)  Unspecified lack of coordination     Problem List There are no active problems to display for this patient.   Patrick Barr, PT 02/12/2017, 5:08 PM  [facility] [address] [place], [place], [address] Phone: [phone]   Fax:  [phone]  Name: Patrick A Purtle MRN: [id number] Date of Birth: 1966-08-22

## 2017-02-12 NOTE — Patient Instructions (Signed)
        Hold stretches 30 seconds repeat throughout the day at least 3x each side    Start in table top position and point tailbone up, let the spine follow by arching all the way up to the top of the head Come back to table top position and repeat 10x

## 2017-02-19 ENCOUNTER — Encounter: Payer: Managed Care, Other (non HMO) | Admitting: Physical Therapy

## 2017-02-26 ENCOUNTER — Encounter: Payer: Managed Care, Other (non HMO) | Admitting: Physical Therapy

## 2017-03-05 ENCOUNTER — Ambulatory Visit: Payer: Managed Care, Other (non HMO) | Attending: Adult Health | Admitting: Physical Therapy

## 2017-03-05 DIAGNOSIS — M62838 Other muscle spasm: Secondary | ICD-10-CM | POA: Insufficient documentation

## 2017-03-05 DIAGNOSIS — R279 Unspecified lack of coordination: Secondary | ICD-10-CM

## 2017-03-05 DIAGNOSIS — M6281 Muscle weakness (generalized): Secondary | ICD-10-CM | POA: Diagnosis present

## 2017-03-05 NOTE — Therapy (Signed)
[facility] [address] [place], [place], [address] Phone: [phone]   Fax:  [phone]  Physical Therapy Treatment  Patient Details  Name: Patrick Barr MRN: [id number] Date of Birth: 1966-03-04 Referring Provider: Jimmey Ralph, NP  Encounter Date: 03/05/2017      PT End of Session - 03/05/17 1558    Visit Number 15   Date for PT Re-Evaluation 06/25/17   PT Start Time 5573   PT Stop Time 1620   PT Time Calculation (min) 47 min   Activity Tolerance Patient tolerated treatment well   Behavior During Therapy [facility] for tasks assessed/performed      Past Medical History:  Diagnosis Date  . Arthritis   . Frequency of urination   . History of kidney stones   . Urgency of urination     Past Surgical History:  Procedure Laterality Date  . EXTRACORPOREAL SHOCK WAVE LITHOTRIPSY  2006  . VASECTOMY Bilateral 05/15/2016   Procedure: VASECTOMY;  Surgeon: Alexis Frock, MD;  Location: [facility];  Service: Urology;  Laterality: Bilateral;    There were no vitals filed for this visit.      Subjective Assessment - 03/05/17 1630    Subjective Saturday was really bad after riding my motorcycle for 2.5 hours.  I couldn't even stand because of pain after that.  Riding my bike was not as bad.  Overall, still having the same amount of really bad days, I can't sleep on my back anymore and feel pain with pressure on my sacrum.  I am thinking about trying a chiropractor   Patient Stated Goals does not want to effect work and get rid of the pain.   Currently in Pain? No/denies            [facility] PT Assessment - 03/05/17 0001      Assessment   Medical Diagnosis R35.0 frequency of mictruition   Onset Date/Surgical Date --  chronic                     [name] Treatment/Exercise - 03/05/17 0001      Neuro Re-ed    Neuro Re-ed Details  proprioception on BOSU step ups and single leg  round side; UE flexion on flat side     Lumbar Exercises: Standing   Forward Lunge 10 reps  reverse lunge on slider   Side Lunge 10 reps  on slider   Other Standing Lumbar Exercises half kneeling on foam matt trunk rotation     Manual Therapy   Manual Therapy Soft tissue mobilization   Soft tissue mobilization lumbar and thoracic paraspinals                PT Education - 03/05/17 1622    Education provided Yes   Education Details back and side lunges with sliders   Person(s) Educated Patient   Methods Explanation;Demonstration;Tactile cues;Verbal cues;Handout   Comprehension Verbalized understanding;Returned demonstration          PT Short Term Goals - 03/05/17 1636      PT SHORT TERM GOAL #1   Title independent with initial HEP   Time 4   Period Weeks   Status Achieved     PT SHORT TERM GOAL #2   Title pain reduced by 50%   Time 4   Period Weeks   Status Not Met     PT SHORT TERM GOAL #3   Title Reduced voiding to  1x/hour on a bad day for improved function at work   Time 4   Period Weeks   Status Achieved           PT Long Term Goals - 03/05/17 1636      PT LONG TERM GOAL #1   Title FOTO < or = to 29% limitation on urinary problems survey   Baseline 38%   Time 16   Period Weeks   Status On-going     PT LONG TERM GOAL #2   Title Pt able to effectively use pelvic floor contractions and relaxation techniques to supress the urge to void   Time 16   Period Weeks   Status Achieved     PT LONG TERM GOAL #3   Title improve pelvic floor soft tissue relaxation for reduction in pain by 75%   Time 16   Period Weeks   Status On-going     PT LONG TERM GOAL #4   Title pt able to void 1x every 1 hours at work for improved function and decreased distractions   Time 16   Period Weeks   Status On-going     PT LONG TERM GOAL #5   Title Pt able to manage symptoms with advanced HEP   Time 16   Period Weeks   Status New               Plan  - 03/05/17 1601    Clinical Impression Statement Patient reports he is still the same and sometimes worse with pain than when started therapy.  He had made some progress and has less urinary frequency.  He continues to have about 1 day/week where his symptoms cause him to miss work.  Pt continues to have muscle spasms throughout thoracic and lumbar spine.  He demonstrates decreased core stability when performing higher level movements such as lunges and balancing on unstable surfaces. Patient will benefit from skilled PT to progress balance exercises for improved core stability and reduce pain and muscle spasms in order to return to PLOF.   Rehab Potential Excellent   PT Frequency Monthy   PT Duration Other (comment)  4 months   PT Treatment/Interventions ADLs/Self Care Home Management;Biofeedback;Cryotherapy;Electrical Stimulation;Moist Heat;Therapeutic activities;Therapeutic exercise;Neuromuscular re-education;Patient/family education;Manual techniques   PT Next Visit Plan stability and balance exercises, manual to thoracolumbar paraspinals   PT Home Exercise Plan progress as needed   Consulted and Agree with Plan of Care Patient      Patient will benefit from skilled therapeutic intervention in order to improve the following deficits and impairments:  Decreased coordination, Decreased strength, Pain, Increased muscle spasms  Visit Diagnosis: Other muscle spasm  Muscle weakness (generalized)  Unspecified lack of coordination     Problem List There are no active problems to display for this patient.   Zannie Cove, PT 03/05/2017, 5:17 PM  [facility] [address] [place], [place], [address] Phone: [phone]   Fax:  [phone]  Name: Patrick Barr MRN: [id number] Date of Birth: 1966-05-17

## 2017-03-05 NOTE — Patient Instructions (Signed)
   Slide or step back into lunge - 10x each side    Slide or step to the side 10 x each side

## 2017-03-12 ENCOUNTER — Encounter: Payer: Managed Care, Other (non HMO) | Admitting: Physical Therapy

## 2017-04-03 ENCOUNTER — Encounter: Payer: Self-pay | Admitting: Physical Therapy

## 2017-04-03 ENCOUNTER — Ambulatory Visit: Payer: Managed Care, Other (non HMO) | Attending: Adult Health | Admitting: Physical Therapy

## 2017-04-03 DIAGNOSIS — R279 Unspecified lack of coordination: Secondary | ICD-10-CM | POA: Diagnosis present

## 2017-04-03 DIAGNOSIS — M6281 Muscle weakness (generalized): Secondary | ICD-10-CM | POA: Insufficient documentation

## 2017-04-03 DIAGNOSIS — M62838 Other muscle spasm: Secondary | ICD-10-CM | POA: Diagnosis present

## 2017-04-03 NOTE — Patient Instructions (Addendum)
Trigger Point Dry Needling  . What is Trigger Point Dry Needling (DN)? o DN is a physical therapy technique used to treat muscle pain and dysfunction. Specifically, DN helps deactivate muscle trigger points (muscle knots).  o A thin filiform needle is used to penetrate the skin and stimulate the underlying trigger point. The goal is for a local twitch response (LTR) to occur and for the trigger point to relax. No medication of any kind is injected during the procedure.   . What Does Trigger Point Dry Needling Feel Like?  o The procedure feels different for each individual patient. Some patients report that they do not actually feel the needle enter the skin and overall the process is not painful. Very mild bleeding may occur. However, many patients feel a deep cramping in the muscle in which the needle was inserted. This is the local twitch response.   Marland Kitchen. How Will I feel after the treatment? o Soreness is normal, and the onset of soreness may not occur for a few hours. Typically this soreness does not last longer than two days.  o Bruising is uncommon, however; ice can be used to decrease any possible bruising.  o In rare cases feeling tired or nauseous after the treatment is normal. In addition, your symptoms may get worse before they get better, this period will typically not last longer than 24 hours.   . What Can I do After My Treatment? o Increase your hydration by drinking more water for the next 24 hours. o You may place ice or heat on the areas treated that have become sore, however, do not use heat on inflamed or bruised areas. Heat often brings more relief post needling. o You can continue your regular activities, but vigorous activity is not recommended initially after the treatment for 24 hours. o DN is best combined with other physical therapy such as strengthening, stretching, and other therapies.    [facility] [address], [address] [place], [place]  [address] Phone # [phone] Fax [phone]     Oblique punch  Stand with the theraband or cable column to your side. Stand with your knees unlocked hips set backwards. Hold the band or pulley at your sternum/mid chest. Slowly push the band/pulley forward in a punch position. Slowly return to the starting position. Maintain a straight line in and out.  20x each side

## 2017-04-03 NOTE — Therapy (Signed)
[facility] [address] [place], [place], [address] Phone: [phone]   Fax:  [phone]  Physical Therapy Treatment  Patient Details  Name: Patrick Barr MRN: [id number] Date of Birth: 10-May-1966 Referring Provider: Jimmey Ralph, NP  Encounter Date: 04/03/2017      PT End of Session - 04/03/17 1532    Visit Number 16   Date for PT Re-Evaluation 06/25/17   PT Start Time 1532   PT Stop Time 1615   PT Time Calculation (min) 43 min   Activity Tolerance Patient tolerated treatment well   Behavior During Therapy [facility] for tasks assessed/performed      Past Medical History:  Diagnosis Date  . Arthritis   . Frequency of urination   . History of kidney stones   . Urgency of urination     Past Surgical History:  Procedure Laterality Date  . EXTRACORPOREAL SHOCK WAVE LITHOTRIPSY  2006  . VASECTOMY Bilateral 05/15/2016   Procedure: VASECTOMY;  Surgeon: Alexis Frock, MD;  Location: [facility];  Service: Urology;  Laterality: Bilateral;    There were no vitals filed for this visit.      Subjective Assessment - 04/03/17 1534    Subjective Went for a short bike ride on motorcycle and that flared it vey bad the next day.  Overall has been doing the stretches and exercises and not getting worse.  States the severe pain is gone but now has constant dull pain instead.   Patient Stated Goals does not want to effect work and get rid of the pain.   Currently in Pain? No/denies                         [facility] Adult PT Treatment/Exercise - 04/03/17 0001      Lumbar Exercises: Standing   Other Standing Lumbar Exercises stretching on ball, thoracic extension and lumbar flexion   Other Standing Lumbar Exercises oblique punches 20x each way     Manual Therapy   Manual Therapy Soft tissue mobilization   Soft tissue mobilization left lumbar and thoracic paraspinals          Trigger  Point Dry Needling - 04/03/17 2159    Consent Given? Yes   Education Handout Provided Yes   Muscles Treated Upper Body --  left lumbar multifidus, thoracic paraspinals              PT Education - 04/03/17 2204    Education provided Yes   Education Details oblique punches   Person(s) Educated Patient   Methods Explanation;Demonstration;Tactile cues;Verbal cues;Handout   Comprehension Verbalized understanding;Returned demonstration          PT Short Term Goals - 03/05/17 1636      PT SHORT TERM GOAL #1   Title independent with initial HEP   Time 4   Period Weeks   Status Achieved     PT SHORT TERM GOAL #2   Title pain reduced by 50%   Time 4   Period Weeks   Status Not Met     PT SHORT TERM GOAL #3   Title Reduced voiding to 1x/hour on a bad day for improved function at work   Time 4   Period Weeks   Status Achieved           PT Long Term Goals - 04/03/17 2206      PT LONG TERM GOAL #1   Title FOTO <  or = to 29% limitation on urinary problems survey   Time 16   Period Weeks   Status On-going     PT LONG TERM GOAL #3   Title improve pelvic floor soft tissue relaxation for reduction in pain by 75%   Time 16   Period Weeks   Status On-going     PT LONG TERM GOAL #4   Title pt able to void 1x every 1 hours at work for improved function and decreased distractions   Time 16   Period Weeks   Status On-going     PT LONG TERM GOAL #5   Title Pt able to manage symptoms with advanced HEP   Time 16   Period Weeks   Status On-going               Plan - 04/03/17 2207    Clinical Impression Statement Patient continues to work on [place].  Pt had some palpable trigger points but they were not causing referred pain like they have in the past.  Pt responded well to dry needling and manual.  He was given information verbally about precautions regarding pneumo thorax due to being needled in the thoracic spine.  Pt will benefit from skilled PT to increase  core strength and balance in order to reduce load on pelvic floor muscles and improve self care function.   PT Treatment/Interventions ADLs/Self Care Home Management;Biofeedback;Cryotherapy;Electrical Stimulation;Moist Heat;Therapeutic activities;Therapeutic exercise;Neuromuscular re-education;Patient/family education;Manual techniques   PT Next Visit Plan f/u on needle #1, add psoas, adductors, QL at next session if responded well, stability and balance exercises, manual to thoracolumbar paraspinals   Consulted and Agree with Plan of Care Patient      Patient will benefit from skilled therapeutic intervention in order to improve the following deficits and impairments:  Decreased coordination, Decreased strength, Pain, Increased muscle spasms  Visit Diagnosis: Other muscle spasm  Muscle weakness (generalized)  Unspecified lack of coordination     Problem List There are no active problems to display for this patient.   Zannie Cove, PT 04/03/2017, 10:12 PM  [facility] [address], [place], [place], [address] Phone: [phone]   Fax:  [phone]  Name: Patrick A Megna MRN: [id number] Date of Birth: 04-13-1966

## 2017-04-23 ENCOUNTER — Encounter: Payer: Self-pay | Admitting: Physical Therapy

## 2017-04-23 ENCOUNTER — Ambulatory Visit: Payer: Managed Care, Other (non HMO) | Admitting: Physical Therapy

## 2017-04-23 DIAGNOSIS — M62838 Other muscle spasm: Secondary | ICD-10-CM

## 2017-04-23 DIAGNOSIS — R279 Unspecified lack of coordination: Secondary | ICD-10-CM

## 2017-04-23 DIAGNOSIS — M6281 Muscle weakness (generalized): Secondary | ICD-10-CM

## 2017-04-23 NOTE — Patient Instructions (Addendum)
   Breathing in and bulging pelvic floor

## 2017-04-23 NOTE — Therapy (Signed)
[facility] [address], [place], [place], [address] Phone: [phone]   Fax:  [phone]  Physical Therapy Treatment  Patient Details  Name: Patrick Barr MRN: [id number] Date of Birth: 12-10-1965 Referring Provider: Jimmey Ralph, NP  Encounter Date: 04/23/2017      PT End of Session - 04/23/17 1533    Visit Number 17   Date for PT Re-Evaluation 06/25/17   PT Start Time 1533   PT Stop Time 1615   PT Time Calculation (min) 42 min   Activity Tolerance Patient tolerated treatment well   Behavior During Therapy [facility] for tasks assessed/performed      Past Medical History:  Diagnosis Date  . Arthritis   . Frequency of urination   . History of kidney stones   . Urgency of urination     Past Surgical History:  Procedure Laterality Date  . EXTRACORPOREAL SHOCK WAVE LITHOTRIPSY  2006  . VASECTOMY Bilateral 05/15/2016   Procedure: VASECTOMY;  Surgeon: Alexis Frock, MD;  Location: [facility];  Service: Urology;  Laterality: Bilateral;    There were no vitals filed for this visit.      Subjective Assessment - 04/23/17 1535    Subjective It is feeling like the really bad days are spacing out.  The intense frequency has improved.  I felt a delayed effect of the needling after 2-3 days, but it seemed to help.   Patient Stated Goals does not want to effect work and get rid of the pain.   Currently in Pain? No/denies                         [name] Treatment/Exercise - 04/23/17 0001      Neuro Re-ed    Neuro Re-ed Details  work on bulging pelvic floor muscles with breathing on physioball and in semi frog squat sitting on ball     Manual Therapy   Manual Therapy Soft tissue mobilization   Soft tissue mobilization left lumbar and thoracic paraspinals, QL          Trigger Point Dry Needling - 04/23/17 1705    Muscles Treated Lower Body --  multifidi thoracic and  lumbar, left QL              PT Education - 04/23/17 1709    Education provided Yes   Education Details frog squat sitting and stretch with breathing into pelvic floor for bulge.   Person(s) Educated Patient   Methods Explanation;Demonstration;Handout   Comprehension Verbalized understanding;Returned demonstration          PT Short Term Goals - 03/05/17 1636      PT SHORT TERM GOAL #1   Title independent with initial HEP   Time 4   Period Weeks   Status Achieved     PT SHORT TERM GOAL #2   Title pain reduced by 50%   Time 4   Period Weeks   Status Not Met     PT SHORT TERM GOAL #3   Title Reduced voiding to 1x/hour on a bad day for improved function at work   Time 4   Period Weeks   Status Achieved           PT Long Term Goals - 04/23/17 1545      PT LONG TERM GOAL #1   Title FOTO < or = to 29% limitation on urinary problems survey   Time  16   Period Weeks   Status On-going     PT LONG TERM GOAL #3   Title improve pelvic floor soft tissue relaxation for reduction in pain by 75%   Baseline level of pain is the same, but frequency is reduced   Time 16   Period Weeks   Status On-going     PT LONG TERM GOAL #4   Title pt able to void 1x every 1 hours at work for improved function and decreased distractions   Baseline used to be 19-22 but recently a "bad day" will be 8-11   Time 16   Period Weeks   Status On-going     PT LONG TERM GOAL #5   Title Pt able to manage symptoms with advanced HEP   Time 16   Period Weeks   Status On-going               Plan - 04/23/17 1533    Clinical Impression Statement Patient had significantly less frequency on bad days and reports much fewer bad days . He was able to do bulging and especially with stretches  Pt will continue to need skilled PT for muscle retraining so he can learn how to relax pelvic floor and manage symptoms more efficiently at home.   Rehab Potential Excellent   PT Frequency Monthy    PT Treatment/Interventions ADLs/Self Care Home Management;Biofeedback;Cryotherapy;Electrical Stimulation;Moist Heat;Therapeutic activities;Therapeutic exercise;Neuromuscular re-education;Patient/family education;Manual techniques   PT Next Visit Plan f/u on needle #2 andbulging exercises, add psoas, adductors next session if responded well, stability and balance exercises, manual to thoracolumbar paraspinals   Consulted and Agree with Plan of Care Patient      Patient will benefit from skilled therapeutic intervention in order to improve the following deficits and impairments:  Decreased coordination, Decreased strength, Pain, Increased muscle spasms  Visit Diagnosis: Other muscle spasm  Muscle weakness (generalized)  Unspecified lack of coordination     Problem List There are no active problems to display for this patient.   Zannie Cove, PT 04/23/2017, 5:10 PM  [facility] [address], [place], [place], [address] Phone: [phone]   Fax:  [phone]  Name: Patrick A Mierzejewski MRN: [id number] Date of Birth: Apr 27, 1966

## 2017-05-07 ENCOUNTER — Encounter: Payer: Managed Care, Other (non HMO) | Admitting: Physical Therapy

## 2017-05-21 ENCOUNTER — Ambulatory Visit: Payer: Managed Care, Other (non HMO) | Attending: Adult Health | Admitting: Physical Therapy

## 2017-05-21 DIAGNOSIS — M62838 Other muscle spasm: Secondary | ICD-10-CM | POA: Insufficient documentation

## 2017-05-21 DIAGNOSIS — M6281 Muscle weakness (generalized): Secondary | ICD-10-CM | POA: Diagnosis present

## 2017-05-21 DIAGNOSIS — R279 Unspecified lack of coordination: Secondary | ICD-10-CM | POA: Diagnosis present

## 2017-05-21 NOTE — Therapy (Addendum)
[facility] [address] [place], [place], [address] Phone: [phone]   Fax:  [phone]  Physical Therapy Treatment  Patient Details  Name: Patrick Barr MRN: [id number] Date of Birth: February 01, 1966 Referring Provider: Jimmey Ralph, NP  Encounter Date: 05/21/2017      PT End of Session - 05/21/17 1534    Visit Number 18   Date for PT Re-Evaluation 06/25/17   PT Start Time 1533   PT Stop Time 1615   PT Time Calculation (min) 42 min   Activity Tolerance Patient tolerated treatment well   Behavior During Therapy [facility] for tasks assessed/performed      Past Medical History:  Diagnosis Date  . Arthritis   . Frequency of urination   . History of kidney stones   . Urgency of urination     Past Surgical History:  Procedure Laterality Date  . EXTRACORPOREAL SHOCK WAVE LITHOTRIPSY  2006  . VASECTOMY Bilateral 05/15/2016   Procedure: VASECTOMY;  Surgeon: Alexis Frock, MD;  Location: [facility];  Service: Urology;  Laterality: Bilateral;    There were no vitals filed for this visit.      Subjective Assessment - 05/21/17 1615    Subjective The urinary freqency has improved but the pain is pretty much constant.  I am more aware of                          [name] Treatment/Exercise - 05/21/17 0001      Manual Therapy   Manual Therapy Soft tissue mobilization;Myofascial release   Soft tissue mobilization thoracic and lumbar paraspinals, left hip flexor   Myofascial Release left thoracolumbar fascia and abdominal fascial release                  PT Short Term Goals - 03/05/17 1636      PT SHORT TERM GOAL #1   Title independent with initial HEP   Time 4   Period Weeks   Status Achieved     PT SHORT TERM GOAL #2   Title pain reduced by 50%   Time 4   Period Weeks   Status Not Met     PT SHORT TERM GOAL #3   Title Reduced voiding to 1x/hour on a bad  day for improved function at work   Time 4   Period Weeks   Status Achieved           PT Long Term Goals - 05/21/17 1621      PT LONG TERM GOAL #1   Title FOTO < or = to 29% limitation on urinary problems survey   Time 16   Period Weeks   Status On-going     PT LONG TERM GOAL #5   Title Pt able to manage symptoms with advanced HEP   Time 16   Period Weeks   Status On-going               Plan - 05/21/17 1618    Clinical Impression Statement Patient continues to have fascial restrictions.  Patient demonstrates improved awareness of pelvic floor and decreased frequency.  This is a good improvement even though his pain has increased.  He has made more of a connection between tension and stress and is focusing more on relaxing.  Pt will benefit from skilled PT for management of pain.    Rehab Potential Excellent   PT Treatment/Interventions ADLs/Self  Care Home Management;Biofeedback;Cryotherapy;Electrical Stimulation;Moist Heat;Therapeutic activities;Therapeutic exercise;Neuromuscular re-education;Patient/family education;Manual techniques   PT Next Visit Plan pelvic floor bulging, sitting on ball   PT Home Exercise Plan progress as needed   Consulted and Agree with Plan of Care Patient      Patient will benefit from skilled therapeutic intervention in order to improve the following deficits and impairments:  Decreased coordination, Decreased strength, Pain, Increased muscle spasms  Visit Diagnosis: Other muscle spasm  Muscle weakness (generalized)  Unspecified lack of coordination     Problem List There are no active problems to display for this patient.   Zannie Cove, PT 05/21/2017, 4:21 PM  [facility] [address] [place], [place], [address] Phone: [phone]   Fax:  [phone]  Name: Patrick Barr MRN: [id number] Date of Birth: 25-Dec-1965  PHYSICAL THERAPY DISCHARGE  SUMMARY  Visits from Start of Care: 18  Current functional level related to goals / functional outcomes: See above   Remaining deficits: See above   Education / Equipment: HEP Plan: Patient agrees to discharge.  Patient goals were not met. Patient is being discharged due to not returning since the last visit.  ?????         Google, PT 07/23/17 3:18 PM

## 2017-06-18 ENCOUNTER — Encounter: Payer: Managed Care, Other (non HMO) | Admitting: Physical Therapy

## 2017-06-25 ENCOUNTER — Emergency Department (HOSPITAL_COMMUNITY): Payer: Managed Care, Other (non HMO)

## 2017-06-25 ENCOUNTER — Emergency Department (HOSPITAL_BASED_OUTPATIENT_CLINIC_OR_DEPARTMENT_OTHER)
Admission: EM | Admit: 2017-06-25 | Discharge: 2017-06-25 | Disposition: A | Discharge status: Home / Self Care | Payer: Managed Care, Other (non HMO) | Attending: Emergency Medicine | Admitting: Emergency Medicine

## 2017-06-25 ENCOUNTER — Ambulatory Visit: Payer: Managed Care, Other (non HMO) | Admitting: Physical Therapy

## 2017-06-25 ENCOUNTER — Emergency Department (HOSPITAL_BASED_OUTPATIENT_CLINIC_OR_DEPARTMENT_OTHER): Payer: Managed Care, Other (non HMO)

## 2017-06-25 ENCOUNTER — Encounter (HOSPITAL_BASED_OUTPATIENT_CLINIC_OR_DEPARTMENT_OTHER): Payer: Self-pay | Admitting: Emergency Medicine

## 2017-06-25 DIAGNOSIS — Z87891 Personal history of nicotine dependence: Secondary | ICD-10-CM | POA: Diagnosis not present

## 2017-06-25 DIAGNOSIS — H5340 Unspecified visual field defects: Secondary | ICD-10-CM | POA: Diagnosis not present

## 2017-06-25 LAB — URINALYSIS, ROUTINE W REFLEX MICROSCOPIC
Bilirubin Urine: NEGATIVE
GLUCOSE, UA: NEGATIVE mg/dL
HGB URINE DIPSTICK: NEGATIVE
Ketones, ur: NEGATIVE mg/dL
Leukocytes, UA: NEGATIVE
Nitrite: NEGATIVE
PROTEIN: NEGATIVE mg/dL
Specific Gravity, Urine: 1.019 (ref 1.005–1.030)
pH: 7 (ref 5.0–8.0)

## 2017-06-25 LAB — CBC WITH DIFFERENTIAL/PLATELET
BASOS ABS: 0 10*3/uL (ref 0.0–0.1)
BASOS PCT: 0 %
EOS ABS: 0.1 10*3/uL (ref 0.0–0.7)
EOS PCT: 2 %
HEMATOCRIT: 47.4 % (ref 39.0–52.0)
Hemoglobin: 16.5 g/dL (ref 13.0–17.0)
Lymphocytes Relative: 27 %
Lymphs Abs: 1.9 10*3/uL (ref 0.7–4.0)
MCH: 31.2 pg (ref 26.0–34.0)
MCHC: 34.8 g/dL (ref 30.0–36.0)
MCV: 89.6 fL (ref 78.0–100.0)
Monocytes Absolute: 0.6 10*3/uL (ref 0.1–1.0)
Monocytes Relative: 8 %
NEUTROS ABS: 4.4 10*3/uL (ref 1.7–7.7)
Neutrophils Relative %: 63 %
PLATELETS: 265 10*3/uL (ref 150–400)
RBC: 5.29 MIL/uL (ref 4.22–5.81)
RDW: 13 % (ref 11.5–15.5)
WBC: 7 10*3/uL (ref 4.0–10.5)

## 2017-06-25 LAB — COMPREHENSIVE METABOLIC PANEL
ALBUMIN: 4.7 g/dL (ref 3.5–5.0)
ALK PHOS: 74 U/L (ref 38–126)
ALT: 29 U/L (ref 17–63)
ANION GAP: 7 (ref 5–15)
AST: 25 U/L (ref 15–41)
BILIRUBIN TOTAL: 0.6 mg/dL (ref 0.3–1.2)
BUN: 11 mg/dL (ref 6–20)
CALCIUM: 9.3 mg/dL (ref 8.9–10.3)
CO2: 28 mmol/L (ref 22–32)
CREATININE: 0.9 mg/dL (ref 0.61–1.24)
Chloride: 105 mmol/L (ref 101–111)
GFR calc Af Amer: >60 mL/min (ref >60)
GFR calc non Af Amer: >60 mL/min (ref >60)
GLUCOSE: 123 mg/dL — AB (ref 65–99)
Potassium: 4.2 mmol/L (ref 3.5–5.1)
Sodium: 140 mmol/L (ref 135–145)
TOTAL PROTEIN: 7.4 g/dL (ref 6.5–8.1)

## 2017-06-25 LAB — RAPID URINE DRUG SCREEN, HOSP PERFORMED
AMPHETAMINES: NOT DETECTED
Barbiturates: NOT DETECTED
Benzodiazepines: NOT DETECTED
Cocaine: NOT DETECTED
Opiates: NOT DETECTED
Tetrahydrocannabinol: POSITIVE — AB

## 2017-06-25 LAB — PROTIME-INR
INR: 0.87
PROTHROMBIN TIME: 11.8 s (ref 11.4–15.2)

## 2017-06-25 LAB — ETHANOL

## 2017-06-25 LAB — TROPONIN I

## 2017-06-25 LAB — APTT: aPTT: 27 seconds (ref 24–36)

## 2017-06-25 MED ORDER — ONDANSETRON HCL 4 MG/2ML IJ SOLN
4.0000 mg | Freq: Once | INTRAMUSCULAR | Status: AC
  Administered 2017-06-25: 4 mg via INTRAVENOUS

## 2017-06-25 MED ORDER — DIPHENHYDRAMINE HCL 50 MG/ML IJ SOLN
25.0000 mg | Freq: Once | INTRAMUSCULAR | Status: AC
  Administered 2017-06-25: 25 mg via INTRAVENOUS
  Filled 2017-06-25: qty 1

## 2017-06-25 MED ORDER — PROCHLORPERAZINE EDISYLATE 5 MG/ML IJ SOLN
10.0000 mg | Freq: Once | INTRAMUSCULAR | Status: AC
  Administered 2017-06-25: 10 mg via INTRAVENOUS
  Filled 2017-06-25: qty 2

## 2017-06-25 MED ORDER — ONDANSETRON HCL 4 MG/2ML IJ SOLN
INTRAMUSCULAR | Status: AC
  Filled 2017-06-25: qty 2

## 2017-06-25 MED ORDER — SODIUM CHLORIDE 0.9 % IV BOLUS (SEPSIS)
1000.0000 mL | Freq: Once | INTRAVENOUS | Status: AC
  Administered 2017-06-25: 1000 mL via INTRAVENOUS

## 2017-06-25 NOTE — ED Notes (Signed)
Dr. Amada Jupiter evaluates PT in hall as room is being cleaned. No TPA at this time.

## 2017-06-25 NOTE — Consult Note (Signed)
Requesting Physician: Dr. Deretha Emory    Chief Complaint: Possible code stroke likely migraine headache sent to [facility] as code stroke  History obtained from:  Patient     HPI:                                                                                                                                         Patrick Barr is an 51 y.o. male who noticed at approximately 0900 hrs. he had bilateral central visual changes that he described as fuzzy vision"[place] TV screen status had black and white when there is no channel on". Over. 10 minutes this became more widespread until it involved his whole vision. It then progressed to shift to the left to just stay left outer portion of his vision. At this time it has almost fully resolved. He did have some nausea with it and is now starting to have a headache. Patient denies any numbness, tingling, weakness, difficulty with speech. Patient was sent for [facility] to ensure that this was not a possible stroke. At this time is not tPA candidate as his symptoms are resolving quickly and his NIH stroke scale is 1.  Date last known well: Date: 06/25/2017 Time last known well: Time: 09:00 tPA Given: No: Symptoms almost fully resolved with NIH stroke scale of 1 Modified Rankin: Rankin Score=0    Past Medical History:  Diagnosis Date  . Arthritis   . Frequency of urination   . History of kidney stones   . Urgency of urination     Past Surgical History:  Procedure Laterality Date  . EXTRACORPOREAL SHOCK WAVE LITHOTRIPSY  2006  . VASECTOMY Bilateral 05/15/2016   Procedure: VASECTOMY;  Surgeon: Sebastian Ache, MD;  Location: [facility];  Service: Urology;  Laterality: Bilateral;    No family history on file. Social History:  reports that he quit smoking about 21 years ago. His smoking use included Cigarettes. He quit after 5.00 years of use. He has never used smokeless tobacco. He  reports that he drinks alcohol. He reports that he uses drugs, including Marijuana.  Allergies:  Allergies  Allergen Reactions  . Bee Venom Shortness Of Breath, Itching and Swelling    Medications:  No current facility-administered medications for this encounter.    Current Outpatient Prescriptions  Medication Sig Dispense Refill  . EPINEPHrine (EPIPEN 2-PAK) 0.3 mg/0.3 mL IJ SOAJ injection Inject 0.3 mg into the muscle as needed.    . Multiple Vitamin (MULTIVITAMIN) tablet Take 1 tablet by mouth daily.    . traMADol (ULTRAM) 50 MG tablet Take 1-2 tablets (50-100 mg total) by mouth every 6 (six) hours as needed for moderate pain. Post-operatively 20 tablet 0     ROS:                                                                                                                                       History obtained from the patient  General ROS: negative for - chills, fatigue, fever, night sweats, weight gain or weight loss Psychological ROS: negative for - behavioral disorder, hallucinations, memory difficulties, mood swings or suicidal ideation Ophthalmic ROS: Positive for - blurry vision, visual changes ENT ROS: negative for - epistaxis, nasal discharge, oral lesions, sore throat, tinnitus or vertigo Allergy and Immunology ROS: negative for - hives or itchy/watery eyes Hematological and Lymphatic ROS: negative for - bleeding problems, bruising or swollen lymph nodes Endocrine ROS: negative for - galactorrhea, hair pattern changes, polydipsia/polyuria or temperature intolerance Respiratory ROS: negative for - cough, hemoptysis, shortness of breath or wheezing Cardiovascular ROS: negative for - chest pain, dyspnea on exertion, edema or irregular heartbeat Gastrointestinal ROS: negative for - abdominal pain, diarrhea, hematemesis, nausea/vomiting or stool  incontinence Genito-Urinary ROS: negative for - dysuria, hematuria, incontinence or urinary frequency/urgency Musculoskeletal ROS: negative for - joint swelling or muscular weakness Neurological ROS: as noted in HPI Dermatological ROS: negative for rash and skin lesion changes  Neurologic Examination:                                                                                                      Blood pressure (!) 144/89, pulse (!) 57, temperature 98.3 F (36.8 C), temperature source Oral, resp. rate 20, height 5\' 10"  (1.778 m), weight 93.9 kg (207 lb), SpO2 100 %.  HEENT-  Normocephalic, no lesions, without obvious abnormality.  Normal external eye and conjunctiva.  Normal TM's bilaterally.  Normal auditory canals and external ears. Normal external nose, mucus membranes and septum.  Normal pharynx. Cardiovascular- S1, S2 normal, pulses palpable throughout   Lungs- chest clear, no wheezing, rales, normal symmetric air entry, Heart exam - S1, S2 normal, no murmur, no gallop, rate regular Abdomen- soft, non-tender; bowel  sounds normal; no masses,  no organomegaly Extremities- no edema Lymph-no adenopathy palpable Musculoskeletal-no joint tenderness, deformity or swelling Skin-warm and dry, no hyperpigmentation, vitiligo, or suspicious lesions  Neurological Examination Mental Status: Alert, oriented, thought content appropriate.  Speech fluent without evidence of aphasia.  Able to follow 3 step commands without difficulty. Cranial Nerves: II: Visual fields grossly normal--does have subjective blurred vision in the left upper quadrant III,IV, VI: ptosis not present, extra-ocular motions intact bilaterally, pupils equal, round, reactive to light and accommodation V,VII: smile symmetric, facial light touch sensation normal bilaterally VIII: hearing normal bilaterally IX,X: uvula rises symmetrically XI: bilateral shoulder shrug XII: midline tongue extension Motor: Right : Upper extremity    5/5    Left:     Upper extremity   5/5  Lower extremity   5/5     Lower extremity   5/5 Tone and bulk:normal tone throughout; no atrophy noted Sensory: Pinprick and light touch intact throughout, bilaterally Deep Tendon Reflexes: 2+ and symmetric throughout Plantars: Right: downgoing   Left: downgoing Cerebellar: normal finger-to-nose,  and normal heel-to-shin test Gait: Not tested       Lab Results: Basic Metabolic Panel:  Recent Labs Lab 06/25/17 1024  NA 140  K 4.2  CL 105  CO2 28  GLUCOSE 123*  BUN 11  CREATININE 0.90  CALCIUM 9.3    Liver Function Tests:  Recent Labs Lab 06/25/17 1024  AST 25  ALT 29  ALKPHOS 74  BILITOT 0.6  PROT 7.4  ALBUMIN 4.7   No results for input(s): LIPASE, AMYLASE in the last 168 hours. No results for input(s): AMMONIA in the last 168 hours.  CBC:  Recent Labs Lab 06/25/17 1024  WBC 7.0  NEUTROABS 4.4  HGB 16.5  HCT 47.4  MCV 89.6  PLT 265    Cardiac Enzymes:  Recent Labs Lab 06/25/17 1024  TROPONINI <0.03    Lipid Panel: No results for input(s): CHOL, TRIG, HDL, CHOLHDL, VLDL, LDLCALC in the last 168 hours.  CBG: No results for input(s): GLUCAP in the last 168 hours.  Microbiology: No results found for this or any previous visit.  Coagulation Studies:  Recent Labs  06/25/17 1024  LABPROT 11.8  INR 0.87    Imaging: Ct Head Code Stroke Wo Contrast  Result Date: 06/25/2017 CLINICAL DATA:  Code stroke. Acute onset of bilateral vision loss lasting 40 minutes this morning. Symptoms began 1.5 hours ago. Vision evolved to partial loss of left visual field. EXAM: CT HEAD WITHOUT CONTRAST TECHNIQUE: Contiguous axial images were obtained from the base of the skull through the vertex without intravenous contrast. COMPARISON:  None. FINDINGS: Brain: No acute infarct, hemorrhage, or mass lesion is present. The ventricles are of normal size. No significant extraaxial fluid collection is present. No significant  white matter disease is present. The brainstem and cerebellum are normal. No focal cortical lesions are evident. Vascular: No hyperdense vessel or unexpected calcification. Skull: Calvarium is intact. No focal lytic or blastic lesions are present. Sinuses/Orbits: Mild mucosal thickening is present bilaterally in the posterior ethmoid air cells. The paranasal sinuses and mastoid air cells are otherwise clear. The globes and orbits are within normal limits. ASPECTS [facility] Stroke Program Early CT Score) - Ganglionic level infarction (caudate, lentiform nuclei, internal capsule, insula, M1-M3 cortex): 7/7 - Supraganglionic infarction (M4-M6 cortex): 3/3 Total score (0-10 with 10 being normal): 10/10 IMPRESSION: 1. Normal CT appearance of the brain. 2. Minimal ethmoid sinus disease 3. ASPECTS is 10/10 These results were  called by telephone at the time of interpretation on 06/25/2017 at 10:47 am to Dr. Clarene Duke, who verbally acknowledged these results. Electronically Signed   By: Marin Roberts M.D.   On: 06/25/2017 10:47       Assessment and plan discussed with with attending physician and they are in agreement.    Felicie Morn PA-C [facility] [phone]  06/25/2017, 11:16 AM   Assessment: 51 y.o. male with sudden onset of blurred vision which has now almost fully resolve. Given history this most likely represents aura followed by migraine headache. Patient has not had a migraine headache in the past and  is not known to have headache history. For this reason we will obtain MRI to further evaluate for possible stroke.   Stroke Risk Factors - none  Recommend: 1) MRI brain without contrast 2) if negative, treat as complicated migraine  Ritta Slot, MD [facility] [phone]  If 7pm- 7am, please page neurology on call as listed in AMION.

## 2017-06-25 NOTE — ED Notes (Signed)
Report to [facility] at  [facility] ED

## 2017-06-25 NOTE — ED Notes (Signed)
PT returns from MRI. PT would not complete full exam. PT refused meds.

## 2017-06-25 NOTE — ED Provider Notes (Signed)
MSE was initiated and I personally evaluated the patient and placed orders (if any) at  1:51 PM on June 25, 2017.  Patient presents to the emergency department today after transfer from outside facility for MRI due to bilateral, partial visual field loss. Patient states that his symptoms started approximate 9 AM this morning. Patient states that the edges of his visual field looks like "snow" on a television screen. States that the visual loss was gradual in onset and persistent his left eye. [facility] where he was being evaluated and sent to the ED for MRI after consulting with neurology Dr. Petra KubaKilpatrick. Patient states that this time his symptoms are improving. He only reports a slight headache at this time. Denies any history of migraines.   Please see prior provider's note for full history and physical.  Patient is overall well-appearing and nontoxic. Alert and oriented 4. Moving all 4 extremities without difficulties. Breathing normally. Mentating well.  Dr. Petra KubaKilpatrick evaluated patient on arrival to the ED. Feels the patient's symptoms are likely due to a complex migraine low suspicion for stroke however given that he does not have a history of migraines, but MRI was warranted. Patient given migraine cocktail. He does report a mild headache. Before patient was transferred to the MRI machine he had complete resolution in his symptoms.  Patient was getting the MRI and halfway through when he had an anxiety attack and wanted to be taken out of MRI. He refused medications and refused the rest of the MRI. States that he does not want to have that testing more. He is comfortable with leaving AMA given all the symptoms have resolved.  I did speak with Dr. Amada JupiterKirkpatrick with neurology. He states that he was able to get the imaging that he needed that was normal. No signs of stroke. He feels comfortable patient being discharged. He should've symptoms likely due to a  migraine. Have given neurology follow-up. Patient remained hemodynamically stable in no acute distress. Discussed patient with my attending Dr. Deretha EmoryZackowski who is agreeable to the above plan.   Pt is hemodynamically stable, in NAD, & able to ambulate in the ED. Evaluation does not show pathology that would require ongoing emergent intervention or inpatient treatment. I explained the diagnosis to the patient. Pain has been managed & has no complaints prior to dc. Pt is comfortable with above plan and is stable for discharge at this time. All questions were answered prior to disposition. Strict return precautions for f/u to the ED were discussed. Encouraged follow up with PCP.      Rise MuLeaphart, Kenneth T, PA-C 06/25/17 1355    Vanetta MuldersZackowski, Scott, MD 06/25/17 [phone]

## 2017-06-25 NOTE — ED Notes (Signed)
Patient transported to CT 

## 2017-06-25 NOTE — ED Provider Notes (Signed)
[facility] Provider Note   CSN: [id number] Arrival date & time: 06/25/17  6579     History   Chief Complaint Chief Complaint  Patient presents with  . Eye Problem    HPI Patrick Barr is a 51 y.o. male.  HPI   Patrick Barr is a 51 y.o. male, with a history of Arthritis, presenting to the ED with Bilateral, partial visual field loss beginning around 9:10 AM this morning. Patient states he was sitting at his computer at work when he Had very quick onset of central circles of vision loss while keeping the peripheral vision intact. Over a period of about 15 minutes, this area of vision loss shifted towards the left and has now stopped changing. The current area vision loss is an incomplete left peripheral section that is not central and not lateralized to the extreme left of his vision. The edges of this block of visual field deficit "looks like 'snow' on a TV that is not set to a correct channel." He denies headache, eye pain, trauma, falls, head injury, dizziness, numbness, weakness, difficulty breathing or swallowing, chest pain, dizziness, facial droop, speech difficulty, or any other complaints. He does not wear contact lenses. He uses corrective lenses for near vision.    Past Medical History:  Diagnosis Date  . Arthritis   . Frequency of urination   . History of kidney stones   . Urgency of urination     There are no active problems to display for this patient.   Past Surgical History:  Procedure Laterality Date  . EXTRACORPOREAL SHOCK WAVE LITHOTRIPSY  2006  . VASECTOMY Bilateral 05/15/2016   Procedure: VASECTOMY;  Surgeon: Sebastian Ache, MD;  Location: [facility];  Service: Urology;  Laterality: Bilateral;       Home Medications    Prior to Admission medications   Medication Sig Start Date End Date Taking? Authorizing Provider  EPINEPHrine (EPIPEN 2-PAK) 0.3 mg/0.3 mL IJ SOAJ injection Inject 0.3 mg into the muscle as needed.     [provider]  Multiple Vitamin (MULTIVITAMIN) tablet Take 1 tablet by mouth daily.    [provider]  traMADol (ULTRAM) 50 MG tablet Take 1-2 tablets (50-100 mg total) by mouth every 6 (six) hours as needed for moderate pain. Post-operatively 05/15/16   Sebastian Ache, MD    Family History No family history on file.  Social History Social History  Substance Use Topics  . Smoking status: Former Smoker    Years: 5.00    Types: Cigarettes    Quit date: 05/07/1996  . Smokeless tobacco: Never Used  . Alcohol use Yes     Comment: 1 drinks a day - beer wine or liquor     Allergies   Bee venom   Review of Systems Review of Systems  Constitutional: Negative for chills and fever.  HENT: Negative for trouble swallowing and voice change.   Eyes: Positive for visual disturbance.  Respiratory: Negative for shortness of breath.   Cardiovascular: Negative for chest pain.  Gastrointestinal: Negative for nausea and vomiting.  Musculoskeletal: Negative for back pain, neck pain and neck stiffness.  Neurological: Negative for dizziness, seizures, syncope, facial asymmetry, speech difficulty, weakness, light-headedness, numbness and headaches.  All other systems reviewed and are negative.    Physical Exam Updated Vital Signs BP (!) 156/75 (BP Location: Right Arm)   Pulse (!) 58   Temp 98.3 F (36.8 C) (Oral)   Resp 18   Ht  5\' 10"  (1.778 m)   Wt 93.9 kg (207 lb)   SpO2 100%   BMI 29.70 kg/m   Physical Exam  Constitutional: He is oriented to person, place, and time. He appears well-developed and well-nourished. No distress.  HENT:  Head: Normocephalic and atraumatic.  No noted swallowing or speech deficit. Patient handles oral secretions without difficulty.  Eyes: Pupils are equal, round, and reactive to light. Conjunctivae and EOM are normal.  Patient has visual field loss in a section of left-sided vision estimated to be about 15. Area of vision loss begins  left of center. Patient regains his vision at the extremes of the left side periphery. Slice of vision loss seems to extend through vertical range of vision. Area of vision loss is present on left sided vision of both eyes.  No noted vertical nystagmus. Cover-uncover test normal.  Neck: Neck supple.  Cardiovascular: Normal rate, regular rhythm, normal heart sounds and intact distal pulses.   Pulmonary/Chest: Effort normal and breath sounds normal. No respiratory distress.  Abdominal: Soft. There is no tenderness. There is no guarding.  Musculoskeletal: He exhibits no edema.  Lymphadenopathy:    He has no cervical adenopathy.  Neurological: He is alert and oriented to person, place, and time.  No sensory deficits. Strength 5/5 in all extremities. No gait disturbance. Coordination intact including heel to shin and finger to nose. Cranial nerves III-XII grossly intact. No facial droop.   Skin: Skin is warm and dry. He is not diaphoretic.  Psychiatric: He has a normal mood and affect. His behavior is normal.  Nursing note and vitals reviewed.    ED Treatments / Results  Labs (all labs ordered are listed, but only abnormal results are displayed) Labs Reviewed  COMPREHENSIVE METABOLIC PANEL - Abnormal; Notable for the following:       Result Value   Glucose, Bld 123 (*)    All other components within normal limits  CBC WITH DIFFERENTIAL/PLATELET  ETHANOL  PROTIME-INR  APTT  TROPONIN I  RAPID URINE DRUG SCREEN, HOSP PERFORMED  URINALYSIS, ROUTINE W REFLEX MICROSCOPIC    EKG  EKG Interpretation None       Radiology Ct Head Code Stroke Wo Contrast  Result Date: 06/25/2017 CLINICAL DATA:  Code stroke. Acute onset of bilateral vision loss lasting 40 minutes this morning. Symptoms began 1.5 hours ago. Vision evolved to partial loss of left visual field. EXAM: CT HEAD WITHOUT CONTRAST TECHNIQUE: Contiguous axial images were obtained from the base of the skull through the vertex without  intravenous contrast. COMPARISON:  None. FINDINGS: Brain: No acute infarct, hemorrhage, or mass lesion is present. The ventricles are of normal size. No significant extraaxial fluid collection is present. No significant white matter disease is present. The brainstem and cerebellum are normal. No focal cortical lesions are evident. Vascular: No hyperdense vessel or unexpected calcification. Skull: Calvarium is intact. No focal lytic or blastic lesions are present. Sinuses/Orbits: Mild mucosal thickening is present bilaterally in the posterior ethmoid air cells. The paranasal sinuses and mastoid air cells are otherwise clear. The globes and orbits are within normal limits. ASPECTS [facility] Stroke Program Early CT Score) - Ganglionic level infarction (caudate, lentiform nuclei, internal capsule, insula, M1-M3 cortex): 7/7 - Supraganglionic infarction (M4-M6 cortex): 3/3 Total score (0-10 with 10 being normal): 10/10 IMPRESSION: 1. Normal CT appearance of the brain. 2. Minimal ethmoid sinus disease 3. ASPECTS is 10/10 These results were called by telephone at the time of interpretation on 06/25/2017 at 10:47 am to  Dr. Clarene Duke, who verbally acknowledged these results. Electronically Signed   By: Marin Roberts M.D.   On: 06/25/2017 10:47    Procedures Procedures (including critical care time)  CRITICAL CARE Performed by: Shawn C Joy Total critical care time: 35 minutes Critical care time was exclusive of separately billable procedures and treating other patients. Critical care was necessary to treat or prevent imminent or life-threatening deterioration. Critical care was time spent personally by me on the following activities: development of treatment plan with patient and/or surrogate as well as nursing, discussions with consultants, evaluation of patient's response to treatment, examination of patient, obtaining history from patient or surrogate, ordering and performing treatments and interventions,  ordering and review of laboratory studies, ordering and review of radiographic studies, pulse oximetry and re-evaluation of patient's condition.  Medications Ordered in ED Medications  ondansetron (ZOFRAN) injection 4 mg (4 mg Intravenous Given 06/25/17 1040)     Initial Impression / Assessment and Plan / ED Course  I have reviewed the triage vital signs and the nursing notes.  Pertinent labs & imaging results that were available during my care of the patient were reviewed by me and considered in my medical decision making (see chart for details).  Clinical Course as of Jun 25 1109  Wed Jun 25, 2017  1012 Spoke with Dr. Amada Jupiter, neurologist at [facility]. Recommends activation of code stroke and emergent transfer to [facility] ED. Does not recommend starting TPA at this time.  [SJ]  1023 Spoke with Crista Curb, EDP at [facility] ED, and made her aware of the patient's impending transfer to her ED.  [SJ]    Clinical Course User Index [SJ] Joy, Shawn C, PA-C    Patient presents with a section of left-sided bilateral visual deficit, rather than true hemianopsia. Code stroke activated. Emergent transfer to [facility].   Findings and plan of care discussed with Frederick Peers, MD. Dr. Clarene Duke personally evaluated and examined this patient.  Final Clinical Impressions(s) / ED Diagnoses   Final diagnoses:  Visual field loss    New Prescriptions New Prescriptions   No medications on file     Concepcion Living 06/25/17 1035    Anselm Pancoast, PA-C 06/25/17 1110    Little, Ambrose Finland, MD 06/30/17 [phone]

## 2017-06-25 NOTE — ED Notes (Addendum)
Patient transported to MRI 

## 2017-06-25 NOTE — ED Notes (Signed)
PT reports last normal was 0900 this morning.

## 2017-06-25 NOTE — ED Triage Notes (Signed)
Pt c/o vision changes, sudden onset at 0900 w/o injury; describes as silver crescent-shaped on outer edges of bilateral vision fields; has changed several times since onset

## 2017-06-25 NOTE — ED Notes (Signed)
Pt c/o nausea.  

## 2017-06-25 NOTE — Discharge Instructions (Signed)
Neurology has reviewed your MRI which is reassuring. Her symptoms may be due to a migraine. May take over-the-counter migraine medications including Excedrin or NSAIDs. Have given you a neurology follow-up. They will contact your for an appointment.  If he develops any worsening symptoms return to the ED. Follow-up with primary care doctor.

## 2017-08-20 ENCOUNTER — Other Ambulatory Visit: Payer: Self-pay | Admitting: General Surgery

## 2017-08-20 DIAGNOSIS — R1032 Left lower quadrant pain: Principal | ICD-10-CM

## 2017-08-20 DIAGNOSIS — G8929 Other chronic pain: Secondary | ICD-10-CM

## 2017-08-25 ENCOUNTER — Ambulatory Visit
Admission: RE | Admit: 2017-08-25 | Discharge: 2017-08-25 | Disposition: A | Discharge status: Home / Self Care | Payer: Managed Care, Other (non HMO) | Source: Ambulatory Visit | Attending: General Surgery | Admitting: General Surgery

## 2017-08-25 DIAGNOSIS — R1032 Left lower quadrant pain: Principal | ICD-10-CM

## 2017-08-25 DIAGNOSIS — G8929 Other chronic pain: Secondary | ICD-10-CM

## 2017-08-25 MED ORDER — IOPAMIDOL (ISOVUE-300) INJECTION 61%
125.0000 mL | Freq: Once | INTRAVENOUS | Status: AC | PRN
  Administered 2017-08-25: 125 mL via INTRAVENOUS

## 2018-12-29 IMAGING — CT CT PELVIS W/ CM
3 series · 13 of 36 positions shown, 19 images · IV contrast (READICAT/WATER & [ID] ISOVUE 300)
Comparison: 05/05/2007

CLINICAL DATA: Dull left pelvic floor areas since 7512.

EXAM:
CT PELVIS WITH CONTRAST
TECHNIQUE: Multidetector CT imaging of the pelvis was performed using the
standard protocol following the bolus administration of intravenous
contrast.
CONTRAST:  125mL OWHSK3-TTT IOPAMIDOL (OWHSK3-TTT) INJECTION 61%

[Series 3: routine pelvis · axial · 0.80mm/px · z∈[-256,-116]mm · 5 of 43 slices shown, 10 images]
[im 8/43  soft-tissue]
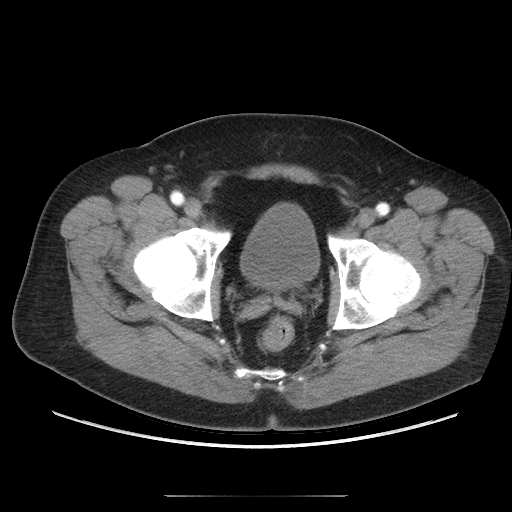
[im 8/43  bone]
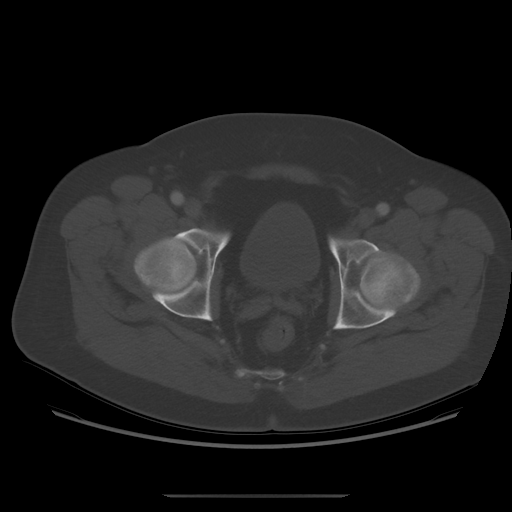
[im 15/43  soft-tissue]
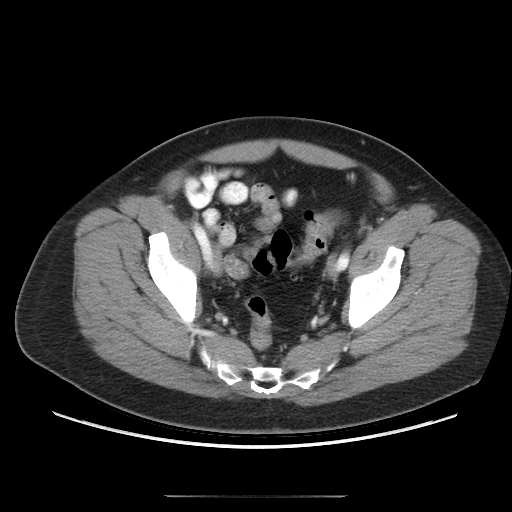
[im 15/43  lung]
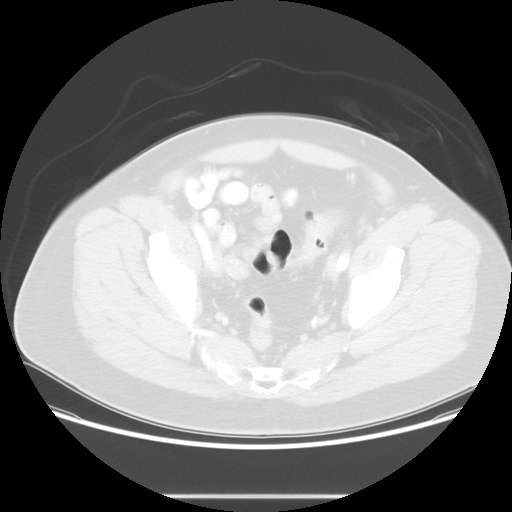
[im 22/43  soft-tissue]
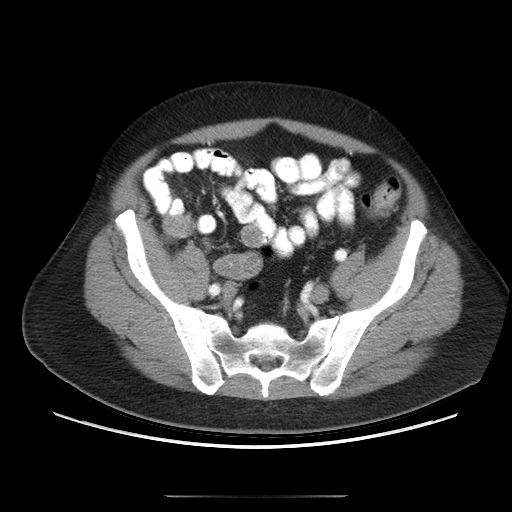
[im 22/43  lung]
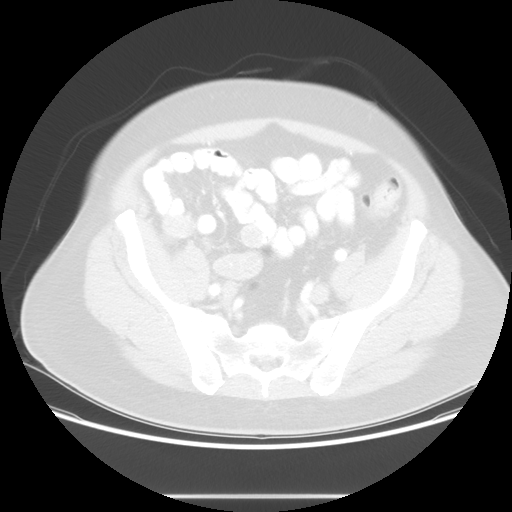
[im 29/43  soft-tissue]
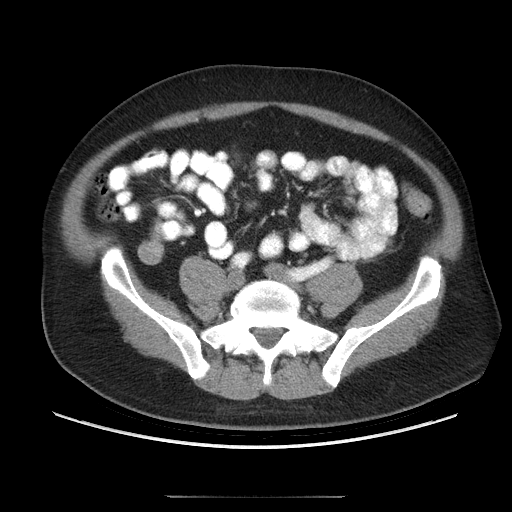
[im 29/43  lung]
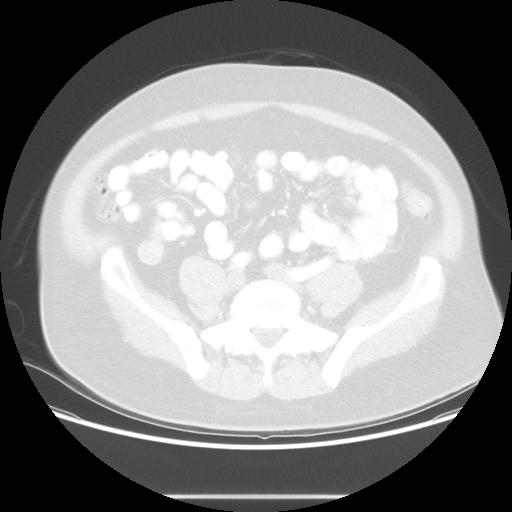
[im 36/43  soft-tissue]
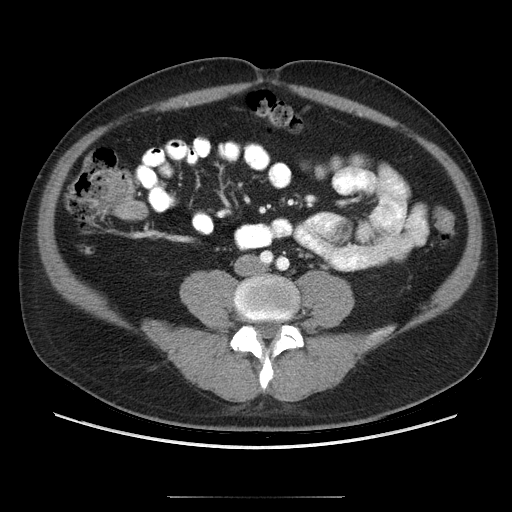
[im 36/43  lung]
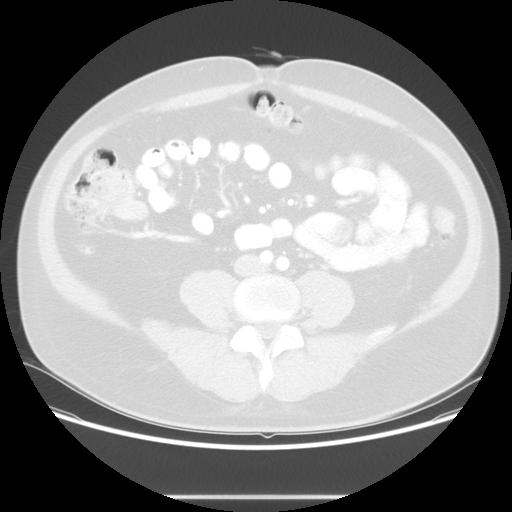

[Series 601: coronal body · coronal · 0.80mm/px · 1 of 126 slices shown, 2 images]
[im 42/126  soft-tissue]
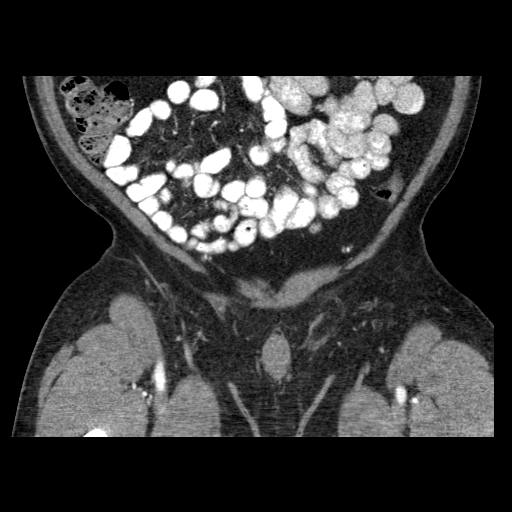
[im 42/126  bone]
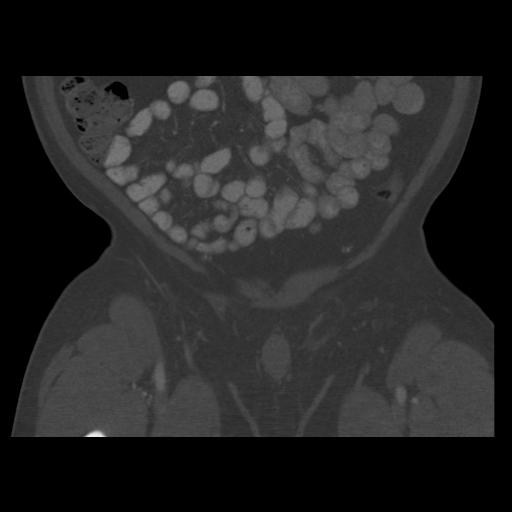

[Series 602: sagittal body · sagittal · 0.80mm/px · 7 of 162 slices shown]
[im 14/162  soft-tissue]
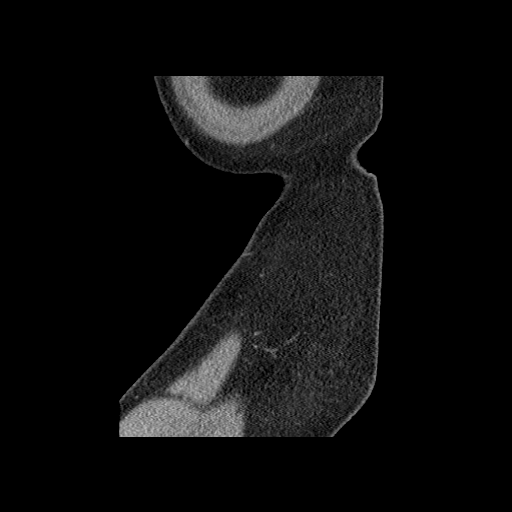
[im 34/162  soft-tissue]
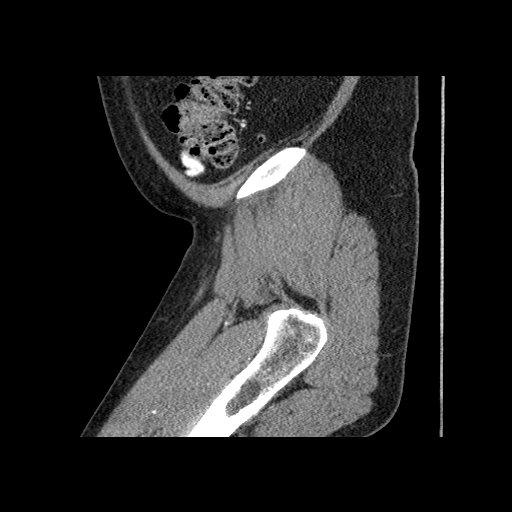
[im 54/162  soft-tissue]
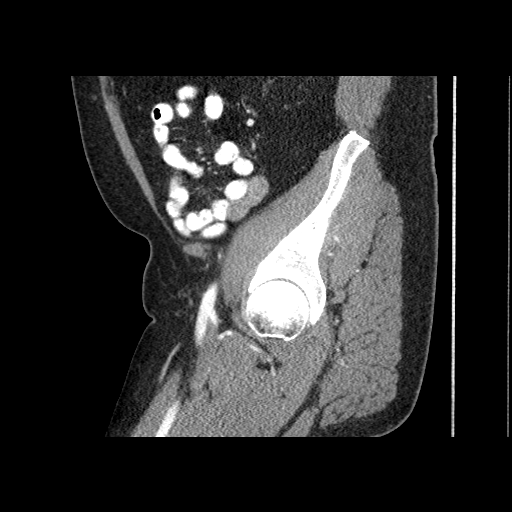
[im 74/162  soft-tissue]
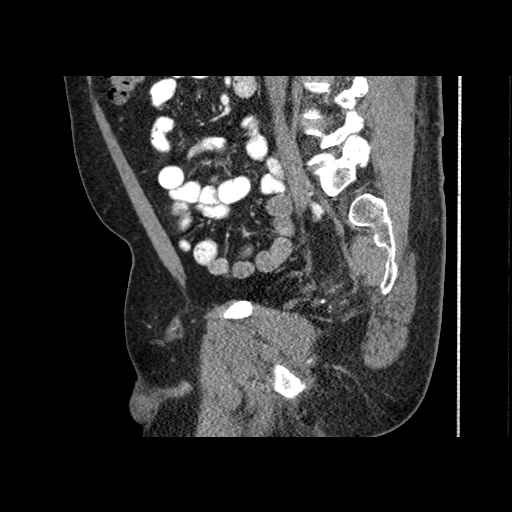
[im 88/162  soft-tissue]
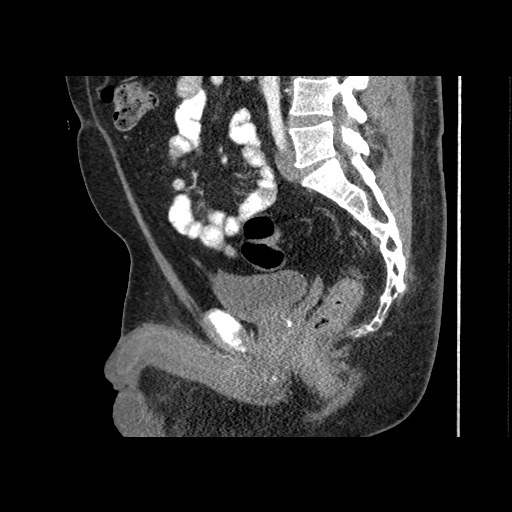
[im 108/162  soft-tissue]
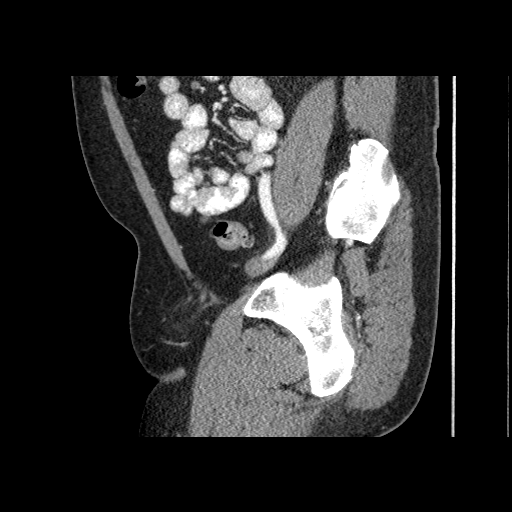
[im 128/162  soft-tissue]
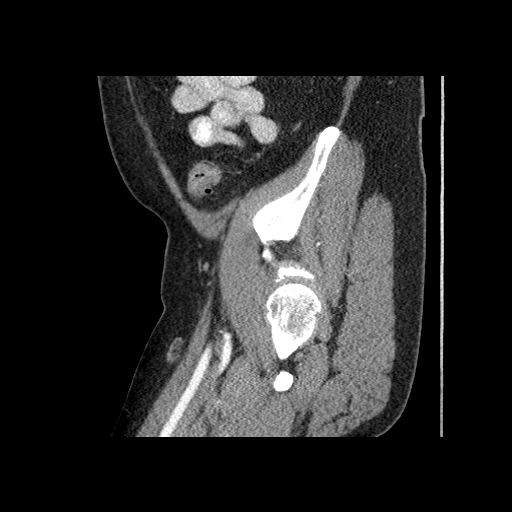

[13 of 36 positions shown; findings below may reference images not displayed]

FINDINGS: Urinary Tract: Distal ureters are nondilated. Bladder unremarkable.

Bowel: Left colonic diverticulosis noted but otherwise, visualized
bowel loops of the pelvis have normal CT imaging features.

Vascular/Lymphatic: No pelvic sidewall lymphadenopathy.

Reproductive:  Prostate gland appears normal size.

Other:  No intraperitoneal free fluid.

Musculoskeletal: Pelvic floor unremarkable. Left groin hernia
contains only fat. Bone windows reveal no worrisome lytic or
sclerotic osseous lesions.
IMPRESSION: Small left groin hernia contains only fat from is similar to prior
study.

Left colonic diverticulosis.

## 2020-08-21 ENCOUNTER — Encounter: Payer: Self-pay | Admitting: Physical Therapy

## 2020-08-21 ENCOUNTER — Ambulatory Visit: Payer: Managed Care, Other (non HMO) | Attending: Urology | Admitting: Physical Therapy

## 2020-08-21 ENCOUNTER — Other Ambulatory Visit: Payer: Self-pay

## 2020-08-21 DIAGNOSIS — M6281 Muscle weakness (generalized): Secondary | ICD-10-CM | POA: Diagnosis not present

## 2020-08-21 DIAGNOSIS — R252 Cramp and spasm: Secondary | ICD-10-CM | POA: Diagnosis present

## 2020-08-21 DIAGNOSIS — G8929 Other chronic pain: Secondary | ICD-10-CM | POA: Diagnosis present

## 2020-08-21 DIAGNOSIS — R102 Pelvic and perineal pain: Secondary | ICD-10-CM | POA: Diagnosis present

## 2020-08-21 NOTE — Therapy (Signed)
[facility] [address] [place], [place], [address] Phone: [phone]   Fax:  [phone]  Physical Therapy Evaluation  Patient Details  Name: Patrick Barr MRN: [id number] Date of Birth: 04/22/1966 Referring Provider (PT): Dr. Marcine Matar   Encounter Date: 08/21/2020   PT End of Session - 08/21/20 0847    Visit Number 1    Date for PT Re-Evaluation 11/13/20    Authorization Type [employer]    PT Start Time 0845    PT Stop Time 0920    PT Time Calculation (min) 35 min    Activity Tolerance Patient tolerated treatment well    Behavior During Therapy [facility] for tasks assessed/performed           Past Medical History:  Diagnosis Date  . Arthritis   . Frequency of urination   . History of kidney stones   . Urgency of urination     Past Surgical History:  Procedure Laterality Date  . EXTRACORPOREAL SHOCK WAVE LITHOTRIPSY  2006  . HERNIA REPAIR Left 2018  . VASECTOMY Bilateral 05/15/2016   Procedure: VASECTOMY;  Surgeon: Sebastian Ache, MD;  Location: [facility];  Service: Urology;  Laterality: Bilateral;    There were no vitals filed for this visit.    Subjective Assessment - 08/21/20 0853    Subjective Patient reports frequency of urgency for urination with pain. Sometimes 8-10 times per day and somedays every 15 minutes. Patient has adjusted his diet and drinks water to work with the urgency. When urinating feels the pain internally and gets winded. Due to the freqent urination feels himself clenching often and needs to relax the pelvic floor.    Patient Stated Goals decrease pain with urination,    Currently in Pain? Yes    Pain Score 6     Pain Location Pelvis   internal   Pain Orientation Mid    Pain Descriptors / Indicators Dull    Pain Type Chronic pain    Pain Onset More than a month ago    Pain Frequency Intermittent    Aggravating Factors  urination    Pain Relieving  Factors sitting on seat for bike riding, sits on a ball to work with the pain    Multiple Pain Sites No              [facility] PT Assessment - 08/21/20 0001      Assessment   Medical Diagnosis Urinary frequency and urgency    Referring Provider (PT) Dr. Marcine Matar    Onset Date/Surgical Date --   3 years     Precautions   Precautions None      Restrictions   Weight Bearing Restrictions No      Balance Screen   Has the patient fallen in the past 6 months No    Has the patient had a decrease in activity level because of a fear of falling?  No    Is the patient reluctant to leave their home because of a fear of falling?  No      Home [employer] information centre manager residence      Prior Function   Vocation Full time employment    Vocation Requirements sitting, back in the office,     Leisure biking outside,       Cognition   Overall Cognitive Status Within Functional Limits for tasks assessed      Posture/Postural Control   Posture/Postural Control  No significant limitations      ROM / Strength   AROM / PROM / Strength AROM;PROM;Strength      AROM   Lumbar Flexion full going to the right      PROM   Right Hip External Rotation  25    Left Hip External Rotation  55      Palpation   Palpation comment tenderness located suprapubic area, increased rib angle and outflare bil.                       Objective measurements completed on examination: See above findings.     Pelvic Floor Special Questions - 08/21/20 0001    Urinary urgency Yes    Urinary frequency some days is every 15 min.     Fecal incontinence --   no trouble   External Palpation tenderness located in the left perineal body, right ischiocavernsosus, along the bullbococcygeus            [name] Treatment/Exercise - 08/21/20 0001      Neuro Re-ed    Neuro Re-ed Details  diaphragmatic breathing with pelvic floor drop with tactile cues, able to relax for a little                    PT Education - 08/21/20 0924    Education Details diaphragamtic breathing  in supine    Person(s) Educated Patient    Methods Explanation;Demonstration    Comprehension Verbalized understanding;Returned demonstration            PT Short Term Goals - 08/21/20 0933      PT SHORT TERM GOAL #1   Title independent with initial HEP    Time 4    Period Weeks    Status New    Target Date 09/18/20      PT SHORT TERM GOAL #2   Title pain reduced by 25% with urination due to the ability to relax the pelvci floor    Time 4    Period Weeks    Status New    Target Date 09/18/20      PT SHORT TERM GOAL #3   Title Reduced voiding to 1x/hour on a bad day for improved function at work    Time 4    Period Weeks    Status Achieved    Target Date 09/18/20             PT Long Term Goals - 08/21/20 0934      PT LONG TERM GOAL #1   Title right hip external rotation passively improved >/= 55 degrees to stretch the pelvic floor more effectively    Baseline --    Time 12    Period Weeks    Status New    Target Date 11/13/20      PT LONG TERM GOAL #2   Title Pt able to effectively use pelvic floor contractions and relaxation techniques to supress the urge to void    Time 12    Period Weeks    Status New    Target Date 11/13/20      PT LONG TERM GOAL #3   Title improve pelvic floor soft tissue relaxation for reduction in pain by 75% with urination    Baseline --    Time 12    Period Weeks    Status New    Target Date 11/13/20      PT LONG TERM GOAL #  4   Title pt able to void 1x every 2 hours at work for improved function and decreased distractions during his day    Baseline ---    Time 12    Period Weeks    Status New    Target Date 11/13/20      PT LONG TERM GOAL #5   Title Pt able to manage symptoms with advanced HEP    Time 12    Period Weeks    Status New    Target Date 11/13/20                  Plan - 08/21/20 0925     Clinical Impression Statement Patient is a 54 year old male with urinary frequency and urgency for many years. Some days he will urinate every 15 mintues and other days is will be more os 8 times per day. When patient urinates he has a deep perineal pain as he is trying to relax the pelvic floor. Patient reports riding his bicycle reduce his pain. Pain level is 5/10 when urinating. Patient has tenderness located in the suprapubic area, left perineal body, right ischiococcygeus, and bulbocavernosus. Patient has decreased lower rib expansion with breath. Patient would benefit from skilled therapy to relax his pelvic floor with urination and reduce his pain while reducing his urgency.    Personal Factors and Comorbidities Comorbidity 1;Past/Current Experience    Comorbidities left hernia repair, had therapy in the past    Examination-Activity Limitations Toileting    Examination-Participation Restrictions Community Activity    Stability/Clinical Decision Making Stable/Uncomplicated    Clinical Decision Making Low    Rehab Potential Good    PT Frequency 1x / week    PT Duration 12 weeks    PT Treatment/Interventions ADLs/Self Care Home Management;Biofeedback;Cryotherapy;Electrical Stimulation;Moist Heat;Neuromuscular re-education;Therapeutic exercise;Therapeutic activities;Patient/family education;Manual techniques;Dry needling    PT Next Visit Plan manual work on the suprapubic area, bulbocavernosus, right ischiocavernosus, butter fly stretch, deep squat stretch, prone press up, work on lower rib cage to expand the abdomen    Consulted and Agree with Plan of Care Patient           Patient will benefit from skilled therapeutic intervention in order to improve the following deficits and impairments:  Decreased coordination, Increased fascial restricitons, Pain, Decreased activity tolerance, Decreased strength, Decreased mobility  Visit Diagnosis: Muscle weakness (generalized) - Plan: PT plan of care  cert/re-cert  Cramp and spasm - Plan: PT plan of care cert/re-cert  Perineal pain in male, chronic - Plan: PT plan of care cert/re-cert     Problem List There are no problems to display for this patient.   [name], PT 08/21/20 9:38 AM   [facility] [address] [place], [place], [address] Phone: [phone]   Fax:  [phone]  Name: Patrick Barr MRN: [id number] Date of Birth: 1966/10/18

## 2020-08-31 ENCOUNTER — Encounter: Payer: Managed Care, Other (non HMO) | Admitting: Physical Therapy

## 2020-09-01 ENCOUNTER — Other Ambulatory Visit: Payer: Self-pay

## 2020-09-01 ENCOUNTER — Ambulatory Visit: Payer: Managed Care, Other (non HMO) | Attending: Urology | Admitting: Physical Therapy

## 2020-09-01 ENCOUNTER — Encounter: Payer: Self-pay | Admitting: Physical Therapy

## 2020-09-01 DIAGNOSIS — G8929 Other chronic pain: Secondary | ICD-10-CM | POA: Diagnosis present

## 2020-09-01 DIAGNOSIS — R252 Cramp and spasm: Secondary | ICD-10-CM | POA: Insufficient documentation

## 2020-09-01 DIAGNOSIS — R102 Pelvic and perineal pain: Secondary | ICD-10-CM | POA: Diagnosis present

## 2020-09-01 DIAGNOSIS — M6281 Muscle weakness (generalized): Secondary | ICD-10-CM | POA: Diagnosis present

## 2020-09-01 NOTE — Patient Instructions (Signed)
Access Code: [email or web address] URL: [email or web address] Date: 09/01/2020 Prepared by: [name]  Exercises Hip Flexor Mobilization with Foam Roll - 1 x daily - 7 x weekly - 1 sets - 10 reps Sidelying IT Band Foam Roll Mobilization - 1 x daily - 7 x weekly - 1 sets - 10 reps Piriformis Mobilization on Foam Roll - 1 x daily - 7 x weekly - 1 sets - 10 reps [facility] [address], [address] [place], [place] [address] Phone # [phone] Fax [phone]

## 2020-09-01 NOTE — Therapy (Signed)
[facility] [address] [place], [place], [address] Phone: [phone]   Fax:  [phone]  Physical Therapy Treatment  Patient Details  Name: Patrick Barr MRN: [id number] Date of Birth: [birth year] Referring Provider (PT): Dr. Marcine Matar   Encounter Date: 09/01/2020   PT End of Session - 09/01/20 1144    Visit Number 2    Date for PT Re-Evaluation 11/13/20    Authorization Type [employer]    PT Start Time 1100    PT Stop Time 1140    PT Time Calculation (min) 40 min    Activity Tolerance Patient tolerated treatment well;No increased pain    Behavior During Therapy WFL for tasks assessed/performed           Past Medical History:  Diagnosis Date   Arthritis    Frequency of urination    History of kidney stones    Urgency of urination     Past Surgical History:  Procedure Laterality Date   EXTRACORPOREAL SHOCK WAVE LITHOTRIPSY  2006   HERNIA REPAIR Left 2018   VASECTOMY Bilateral 05/15/2016   Procedure: VASECTOMY;  Surgeon: Sebastian Ache, MD;  Location: [facility];  Service: Urology;  Laterality: Bilateral;    There were no vitals filed for this visit.   Subjective Assessment - 09/01/20 1105    Subjective I have been able to stop the urge 1-2 times. I had couple of painful days.Sitting on the ball but spreads my hips.    Patient Stated Goals decrease pain with urination,    Currently in Pain? Yes    Pain Score 6     Pain Location Pelvis   internal   Pain Orientation Mid    Pain Descriptors / Indicators Dull    Pain Type Chronic pain    Pain Onset More than a month ago    Pain Frequency Intermittent    Aggravating Factors  urination    Pain Relieving Factors sitting on seat for bike riding, sits on a ball to work with the pain    Multiple Pain Sites No                             [name] Treatment/Exercise - 09/01/20 0001      Lumbar  Exercises: Stretches   Quad Stretch Right;Left;1 rep;60 seconds    Quad Stretch Limitations foam roll prone    ITB Stretch Right;Left;1 rep;60 seconds    ITB Stretch Limitations foam roll    Piriformis Stretch Right;Left;1 rep;60 seconds    Piriformis Stretch Limitations using foam roll    Other Lumbar Stretch Exercise sitting on foam roll and massage the pelvic floor      Manual Therapy   Manual Therapy Myofascial release;Soft tissue mobilization    Soft tissue mobilization along the right ischiocavernosus    Myofascial Release fascial release suprapubically over the bladder and left lower and quadrant                  PT Education - 09/01/20 1144    Education Details Access Code: [email or web address]; using the foam roll to massage the pelvic floor muscles    Methods Explanation;Demonstration;Verbal cues;Handout    Comprehension Returned demonstration;Verbalized understanding            PT Short Term Goals - 08/21/20 0933      PT SHORT TERM GOAL #1   Title independent with initial HEP  Time 4    Period Weeks    Status New    Target Date 09/18/20      PT SHORT TERM GOAL #2   Title pain reduced by 25% with urination due to the ability to relax the pelvci floor    Time 4    Period Weeks    Status New    Target Date 09/18/20      PT SHORT TERM GOAL #3   Title Reduced voiding to 1x/hour on a bad day for improved function at work    Time 4    Period Weeks    Status Achieved    Target Date 09/18/20             PT Long Term Goals - 08/21/20 0934      PT LONG TERM GOAL #1   Title right hip external rotation passively improved >/= 55 degrees to stretch the pelvic floor more effectively    Baseline --    Time 12    Period Weeks    Status New    Target Date 11/13/20      PT LONG TERM GOAL #2   Title Pt able to effectively use pelvic floor contractions and relaxation techniques to supress the urge to void    Time 12    Period Weeks    Status New    Target Date  11/13/20      PT LONG TERM GOAL #3   Title improve pelvic floor soft tissue relaxation for reduction in pain by 75% with urination    Baseline --    Time 12    Period Weeks    Status New    Target Date 11/13/20      PT LONG TERM GOAL #4   Title pt able to void 1x every 2 hours at work for improved function and decreased distractions during his day    Baseline ---    Time 12    Period Weeks    Status New    Target Date 11/13/20      PT LONG TERM GOAL #5   Title Pt able to manage symptoms with advanced HEP    Time 12    Period Weeks    Status New    Target Date 11/13/20                 Plan - 09/01/20 1145    Clinical Impression Statement Patient was able to reduce the urge 2 times since last visit. Patient has fascial restrictions in the suprapubic area and left lower quadrant. Patient was able to do the foam rolling on his muscles. Patient will benefit from skilled therapy to relax his pelvic floor with urination and reduce his pain with urgency.    Personal Factors and Comorbidities Comorbidity 1;Past/Current Experience    Comorbidities left hernia repair, had therapy in the past    Examination-Activity Limitations Toileting    Examination-Participation Restrictions Community Activity    Stability/Clinical Decision Making Stable/Uncomplicated    Rehab Potential Good    PT Frequency 1x / week    PT Duration 12 weeks    PT Treatment/Interventions ADLs/Self Care Home Management;Biofeedback;Cryotherapy;Electrical Stimulation;Moist Heat;Neuromuscular re-education;Therapeutic exercise;Therapeutic activities;Patient/family education;Manual techniques;Dry needling    PT Next Visit Plan pelvic floor drop with fingers on the perineal body, work on the bladder and bulbocavernosus, prone press up, rib cage expansion    PT Home Exercise Plan Access Code: [email or web address]    Recommended Other Services MD signed initial  eval    Consulted and Agree with Plan of Care Patient            Patient will benefit from skilled therapeutic intervention in order to improve the following deficits and impairments:  Decreased coordination, Increased fascial restricitons, Pain, Decreased activity tolerance, Decreased strength, Decreased mobility  Visit Diagnosis: Muscle weakness (generalized)  Cramp and spasm  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   [name], PT 09/01/20 11:49 AM   [facility] [address], [address] [place], [place], [address] Phone: [phone]   Fax:  [phone]  Name: Patrick A Balogh MRN: [id number] Date of Birth: 1966-04-01

## 2020-09-07 ENCOUNTER — Other Ambulatory Visit: Payer: Self-pay

## 2020-09-07 ENCOUNTER — Encounter: Payer: Self-pay | Admitting: Physical Therapy

## 2020-09-07 ENCOUNTER — Ambulatory Visit: Payer: Managed Care, Other (non HMO) | Admitting: Physical Therapy

## 2020-09-07 DIAGNOSIS — M6281 Muscle weakness (generalized): Secondary | ICD-10-CM | POA: Diagnosis not present

## 2020-09-07 DIAGNOSIS — R102 Pelvic and perineal pain: Secondary | ICD-10-CM

## 2020-09-07 DIAGNOSIS — R252 Cramp and spasm: Secondary | ICD-10-CM

## 2020-09-07 DIAGNOSIS — G8929 Other chronic pain: Secondary | ICD-10-CM

## 2020-09-07 NOTE — Therapy (Signed)
[facility] [address] [place], [place], [address] Phone: [phone]   Fax:  [phone]  Physical Therapy Treatment  Patient Details  Name: Patrick Barr MRN: [id number] Date of Birth: Jan 26, 1966 Referring Provider (PT): Dr. Marcine Matar   Encounter Date: 09/07/2020   PT End of Session - 09/07/20 1659    Visit Number 3    Date for PT Re-Evaluation 11/13/20    Authorization Type [employer]    PT Start Time 1615    PT Stop Time 1655    PT Time Calculation (min) 40 min    Activity Tolerance Patient tolerated treatment well;No increased pain    Behavior During Therapy WFL for tasks assessed/performed           Past Medical History:  Diagnosis Date  . Arthritis   . Frequency of urination   . History of kidney stones   . Urgency of urination     Past Surgical History:  Procedure Laterality Date  . EXTRACORPOREAL SHOCK WAVE LITHOTRIPSY  2006  . HERNIA REPAIR Left 2018  . VASECTOMY Bilateral 05/15/2016   Procedure: VASECTOMY;  Surgeon: Sebastian Ache, MD;  Location: [facility];  Service: Urology;  Laterality: Bilateral;    There were no vitals filed for this visit.   Subjective Assessment - 09/07/20 1618    Subjective The stretches are helping. The Oxybutin helps the pain and spasms. The medication was 35% effective.    Patient Stated Goals decrease pain with urination,    Currently in Pain? Yes    Pain Score 6     Pain Location Pelvis   internal   Pain Orientation Mid    Pain Descriptors / Indicators Dull    Pain Onset More than a month ago    Pain Frequency Intermittent    Aggravating Factors  urination    Pain Relieving Factors sitting on seat for bike riding, sit on a ball to work with the pain                             [name] Treatment/Exercise - 09/07/20 0001      Manual Therapy   Manual Therapy Myofascial release    Manual therapy comments  educated patient on how to release around the bladder and lift the intestines off the bladder to reduce irrtation to the bladder    Myofascial Release fascial release suprapubically over the bladder and left lower and quadrant, mesenteric root, the iliocecal valve, pubovesical ligaments while monitoring for pain and fascial restrictions   patient was laying with hips on a pillow                 PT Education - 09/07/20 1658    Education Details how to perform fascial releases of the bladder and lift the intestines off the bladder    Person(s) Educated Patient    Methods Explanation;Demonstration    Comprehension Verbalized understanding;Returned demonstration            PT Short Term Goals - 09/07/20 1702      PT SHORT TERM GOAL #1   Title independent with initial HEP    Time 4    Status Achieved             PT Long Term Goals - 08/21/20 0934      PT LONG TERM GOAL #1   Title right hip external rotation passively improved >/=  55 degrees to stretch the pelvic floor more effectively    Baseline --    Time 12    Period Weeks    Status New    Target Date 11/13/20      PT LONG TERM GOAL #2   Title Pt able to effectively use pelvic floor contractions and relaxation techniques to supress the urge to void    Time 12    Period Weeks    Status New    Target Date 11/13/20      PT LONG TERM GOAL #3   Title improve pelvic floor soft tissue relaxation for reduction in pain by 75% with urination    Baseline --    Time 12    Period Weeks    Status New    Target Date 11/13/20      PT LONG TERM GOAL #4   Title pt able to void 1x every 2 hours at work for improved function and decreased distractions during his day    Baseline ---    Time 12    Period Weeks    Status New    Target Date 11/13/20      PT LONG TERM GOAL #5   Title Pt able to manage symptoms with advanced HEP    Time 12    Period Weeks    Status New    Target Date 11/13/20                  Plan - 09/07/20 1700    Clinical Impression Statement Patient is doing his stretches and they are helping. Patient had fascial tightness suprapubically and lower abdominal area but after manual work there was less. Patient still has pain with urgency but feels the muscles are more relaxed. Patient will benefit from skilled therapy to relax his pelvic floor with urination and reduce his pain with urgency.    Personal Factors and Comorbidities Comorbidity 1;Past/Current Experience    Comorbidities left hernia repair, had therapy in the past    Examination-Activity Limitations Toileting    Examination-Participation Restrictions Community Activity    Stability/Clinical Decision Making Stable/Uncomplicated    Rehab Potential Good    PT Frequency 1x / week    PT Duration 12 weeks    PT Treatment/Interventions ADLs/Self Care Home Management;Biofeedback;Cryotherapy;Electrical Stimulation;Moist Heat;Neuromuscular re-education;Therapeutic exercise;Therapeutic activities;Patient/family education;Manual techniques;Dry needling    PT Next Visit Plan pelvic floor drop with fingers on the perineal body, work on the bladder and bulbocavernosus, prone press up, rib cage expansion; go over goals    PT Home Exercise Plan Access Code: [email or web address]    Consulted and Agree with Plan of Care Patient           Patient will benefit from skilled therapeutic intervention in order to improve the following deficits and impairments:  Decreased coordination, Increased fascial restricitons, Pain, Decreased activity tolerance, Decreased strength, Decreased mobility  Visit Diagnosis: Muscle weakness (generalized)  Cramp and spasm  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   [name], PT 09/07/20 5:03 PM   [facility] [address], [address] [place], [place], [address] Phone: [phone]   Fax:   [phone]  Name: Patrick Barr MRN: [id number] Date of Birth: 1966/07/26

## 2020-09-29 ENCOUNTER — Ambulatory Visit: Payer: Managed Care, Other (non HMO) | Attending: Urology | Admitting: Physical Therapy

## 2020-09-29 ENCOUNTER — Encounter: Payer: Self-pay | Admitting: Physical Therapy

## 2020-09-29 ENCOUNTER — Other Ambulatory Visit: Payer: Self-pay

## 2020-09-29 DIAGNOSIS — M6281 Muscle weakness (generalized): Secondary | ICD-10-CM | POA: Insufficient documentation

## 2020-09-29 DIAGNOSIS — G8929 Other chronic pain: Secondary | ICD-10-CM | POA: Diagnosis present

## 2020-09-29 DIAGNOSIS — R102 Pelvic and perineal pain: Secondary | ICD-10-CM | POA: Diagnosis present

## 2020-09-29 DIAGNOSIS — R252 Cramp and spasm: Secondary | ICD-10-CM | POA: Diagnosis not present

## 2020-09-29 NOTE — Patient Instructions (Addendum)
° °  Self treatment for pelvic pain   Connect PT  65 cm ball  Vagus nerve  [facility] [address], [address] [place], [place] [address] Phone # [phone] Fax [phone]

## 2020-09-29 NOTE — Therapy (Signed)
[facility] [address] [place], [place], [address] Phone: [phone]   Fax:  [phone]  Physical Therapy Treatment  Patient Details  Name: Patrick Barr MRN: [id number] Date of Birth: Apr 24, 1966 Referring Provider (PT): Dr. Marcine Matar   Encounter Date: 09/29/2020   PT End of Session - 09/29/20 0846    Visit Number 4    Date for PT Re-Evaluation 11/13/20    Authorization Type [employer]    PT Start Time 0800    PT Stop Time 0843    PT Time Calculation (min) 43 min    Activity Tolerance Patient tolerated treatment well;No increased pain    Behavior During Therapy WFL for tasks assessed/performed           Past Medical History:  Diagnosis Date  . Arthritis   . Frequency of urination   . History of kidney stones   . Urgency of urination     Past Surgical History:  Procedure Laterality Date  . EXTRACORPOREAL SHOCK WAVE LITHOTRIPSY  2006  . HERNIA REPAIR Left 2018  . VASECTOMY Bilateral 05/15/2016   Procedure: VASECTOMY;  Surgeon: Sebastian Ache, MD;  Location: [facility];  Service: Urology;  Laterality: Bilateral;    There were no vitals filed for this visit.   Subjective Assessment - 09/29/20 0804    Subjective I have had no excessive pain but days of extreme frequency. Frequency has not changed. I have not been able to bike ride.    Patient Stated Goals decrease pain with urination,    Currently in Pain? Yes    Pain Score 7    least is 4/10   Pain Location Pelvis   internal   Pain Orientation Mid    Pain Descriptors / Indicators Dull    Pain Type Chronic pain    Pain Onset More than a month ago    Pain Frequency Intermittent    Aggravating Factors  urination    Pain Relieving Factors sitting on seat for bike riding, sit on a ball at work    Multiple Pain Sites No                             [name] Treatment/Exercise - 09/29/20 0001       Self-Care   Self-Care Other Self-Care Comments    Other Self-Care Comments  education on different ball sizes, education on perineal massage, and information on the vagus nerve      Exercises   Exercises Other Exercises    Other Exercises  sitting on 65cm ball to massage the pelvic floor with hip circles, figure 8, side to side, diagonals      Manual Therapy   Manual Therapy Soft tissue mobilization    Soft tissue mobilization using the prickly roller to the hip adductors, manual work to the hip adductor insertion, along thelevator ani, around the ischial tuberosity, and isciococcygeus                  PT Education - 09/29/20 0845    Education Details perineal massage externally; information on different ball sizes; information on the vagus nerve    Person(s) Educated Patient    Methods Explanation;Handout    Comprehension Verbalized understanding;Returned demonstration            PT Short Term Goals - 09/29/20 0850      PT SHORT TERM GOAL #1  Title independent with initial HEP    Time 4    Period Weeks    Status Achieved    Target Date 09/18/20      PT SHORT TERM GOAL #2   Title pain reduced by 25% with urination due to the ability to relax the pelvci floor    Baseline still has the pain    Time 4    Period Weeks    Status On-going    Target Date 09/18/20      PT SHORT TERM GOAL #3   Title Reduced voiding to 1x/hour on a bad day for improved function at work    Time 4    Period Weeks    Status Achieved             PT Long Term Goals - 09/29/20 0851      PT LONG TERM GOAL #1   Title right hip external rotation passively improved >/= 55 degrees to stretch the pelvic floor more effectively    Time 12    Period Weeks    Status On-going      PT LONG TERM GOAL #2   Title Pt able to effectively use pelvic floor contractions and relaxation techniques to supress the urge to void    Time 12    Period Weeks    Status On-going      PT LONG TERM GOAL #3     Title improve pelvic floor soft tissue relaxation for reduction in pain by 75% with urination    Time 12    Period Weeks    Status On-going      PT LONG TERM GOAL #4   Title pt able to void 1x every 2 hours at work for improved function and decreased distractions during his day    Time 12    Period Weeks    Status On-going      PT LONG TERM GOAL #5   Title Pt able to manage symptoms with advanced HEP    Time 12    Period Weeks    Status On-going                 Plan - 09/29/20 0847    Clinical Impression Statement Patient feel best when on his bike and able to move the seat in a sway postion for 1 hour. Patient keeps his pelvic floor in constant tension even when he does not have the urge to urinate. Patient has tightness in the hip adductors and around the ischial tuberosity.Patient will benefit from skillled therapy to relax his pelvic floor with urination and reduce his pain with urgency.    Personal Factors and Comorbidities Comorbidity 1;Past/Current Experience    Comorbidities left hernia repair, had therapy in the past    Examination-Activity Limitations Toileting    Examination-Participation Restrictions Community Activity    Stability/Clinical Decision Making Stable/Uncomplicated    Rehab Potential Good    PT Frequency 1x / week    PT Duration 12 weeks    PT Treatment/Interventions ADLs/Self Care Home Management;Biofeedback;Cryotherapy;Electrical Stimulation;Moist Heat;Neuromuscular re-education;Therapeutic exercise;Therapeutic activities;Patient/family education;Manual techniques;Dry needling    PT Next Visit Plan pelvic floor drop with fingers on the perineal body, work on the bladder and bulbocavernosus, prone press up, rib cage expansion; go over goals; work on the vagus nerve, maybe internal work    PT Home Exercise Plan Access Code: [email or web address]    Consulted and Agree with Plan of Care Patient  Patient will benefit from skilled therapeutic  intervention in order to improve the following deficits and impairments:  Decreased coordination, Increased fascial restricitons, Pain, Decreased activity tolerance, Decreased strength, Decreased mobility  Visit Diagnosis: Cramp and spasm  Muscle weakness (generalized)  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   [name], PT 09/29/20 8:52 AM   [facility] [address] [place], [place], [address] Phone: [phone]   Fax:  [phone]  Name: Patrick A Coley MRN: [id number] Date of Birth: September 25, 1966

## 2020-10-06 ENCOUNTER — Ambulatory Visit: Payer: Managed Care, Other (non HMO) | Admitting: Physical Therapy

## 2020-10-06 ENCOUNTER — Other Ambulatory Visit: Payer: Self-pay

## 2020-10-06 ENCOUNTER — Encounter: Payer: Self-pay | Admitting: Physical Therapy

## 2020-10-06 DIAGNOSIS — R252 Cramp and spasm: Secondary | ICD-10-CM | POA: Diagnosis not present

## 2020-10-06 DIAGNOSIS — M6281 Muscle weakness (generalized): Secondary | ICD-10-CM

## 2020-10-06 DIAGNOSIS — R102 Pelvic and perineal pain: Secondary | ICD-10-CM

## 2020-10-06 NOTE — Therapy (Signed)
[facility] [address], [address] [place], [place], [address] Phone: [phone]   Fax:  [phone]  Physical Therapy Treatment  Patient Details  Name: Patrick Barr MRN: [id number] Date of Birth: January 03, 1966 Referring Provider (PT): Dr. Marcine Matar   Encounter Date: 10/06/2020   PT End of Session - 10/06/20 0840    Visit Number 5    Date for PT Re-Evaluation 11/13/20    Authorization Type [employer]    PT Start Time 0800    PT Stop Time 0838    PT Time Calculation (min) 38 min    Activity Tolerance Patient tolerated treatment well;No increased pain    Behavior During Therapy WFL for tasks assessed/performed           Past Medical History:  Diagnosis Date  . Arthritis   . Frequency of urination   . History of kidney stones   . Urgency of urination     Past Surgical History:  Procedure Laterality Date  . EXTRACORPOREAL SHOCK WAVE LITHOTRIPSY  2006  . HERNIA REPAIR Left 2018  . VASECTOMY Bilateral 05/15/2016   Procedure: VASECTOMY;  Surgeon: Sebastian Ache, MD;  Location: [facility];  Service: Urology;  Laterality: Bilateral;    There were no vitals filed for this visit.   Subjective Assessment - 10/06/20 0804    Subjective I was sore for one day. The muscles were relaxed. I felt the internal tightning was easy to let go.    Patient Stated Goals decrease pain with urination,    Currently in Pain? Yes    Pain Score 5     Pain Location Pelvis   internal   Pain Orientation Mid    Pain Descriptors / Indicators Dull    Pain Type Chronic pain    Pain Onset More than a month ago    Pain Frequency Intermittent    Aggravating Factors  urination    Pain Relieving Factors sitting on seat for bike riding, sit on a ball at work    Multiple Pain Sites No                             [name] Treatment/Exercise - 10/06/20 0001      Exercises   Exercises Other Exercises     Other Exercises  sitting on 65cm ball to massage the pelvic floor with hip circles, figure 8, side to side, diagonals      Lumbar Exercises: Stretches   ITB Stretch Right;Left;1 rep;60 seconds    ITB Stretch Limitations on foam roll    Piriformis Stretch Right;Left;1 rep;60 seconds    Piriformis Stretch Limitations on foam roll      Lumbar Exercises: Quadruped   Other Quadruped Lumbar Exercises hip hinge 10x2 and hip sway 10x2      Manual Therapy   Manual Therapy Soft tissue mobilization;Passive ROM    Manual therapy comments sit on a lacrosse ball to massage the perineal body    Soft tissue mobilization in quadruped massage the perineal body with different hip movements, pelvic sway and trigger point release    Passive ROM passively stretch bil. hip rotators and hip flexors                  PT Education - 10/06/20 0839    Education Details use a tennis ball to sit on and massage the perineal body    Person(s) Educated Patient  Methods Explanation;Demonstration    Comprehension Verbalized understanding;Returned demonstration            PT Short Term Goals - 09/29/20 0850      PT SHORT TERM GOAL #1   Title independent with initial HEP    Time 4    Period Weeks    Status Achieved    Target Date 09/18/20      PT SHORT TERM GOAL #2   Title pain reduced by 25% with urination due to the ability to relax the pelvci floor    Baseline still has the pain    Time 4    Period Weeks    Status On-going    Target Date 09/18/20      PT SHORT TERM GOAL #3   Title Reduced voiding to 1x/hour on a bad day for improved function at work    Time 4    Period Weeks    Status Achieved             PT Long Term Goals - 10/06/20 0810      PT LONG TERM GOAL #1   Title right hip external rotation passively improved >/= 55 degrees to stretch the pelvic floor more effectively    Time 12    Status On-going      PT LONG TERM GOAL #2   Title Pt able to effectively use pelvic  floor contractions and relaxation techniques to supress the urge to void    Baseline had some success    Time 12    Period Weeks    Status On-going      PT LONG TERM GOAL #3   Title improve pelvic floor soft tissue relaxation for reduction in pain by 75% with urination    Baseline 20% better    Time 12    Period Weeks    Status On-going      PT LONG TERM GOAL #4   Title pt able to void 1x every 2 hours at work for improved function and decreased distractions during his day    Baseline void from 120 min to 15 min    Time 12    Period Weeks    Status On-going      PT LONG TERM GOAL #5   Title Pt able to manage symptoms with advanced HEP    Time 12    Period Weeks    Status On-going                 Plan - 10/06/20 0840    Clinical Impression Statement Patient pain is now a 5/10 instead of 7/10. Patient reports he is using the techniques to work the urge to void and is able to help for several seconds. Patient feels he is able to relax the pelvic floor when it feels tense 20% of the time. Patient has trigger points in the perineal body,right bulbococcygeus, and bil. ishiocavernosus. Patient has tighter right hip external rotators. Patient will benefit from skilled therapy to relax his pelvic floor with urination and reduce his pain with urgency.    Personal Factors and Comorbidities Comorbidity 1;Past/Current Experience    Comorbidities left hernia repair, had therapy in the past    Examination-Activity Limitations Toileting    Examination-Participation Restrictions Community Activity    Stability/Clinical Decision Making Stable/Uncomplicated    Rehab Potential Good    PT Frequency 1x / week    PT Duration 12 weeks    PT Treatment/Interventions ADLs/Self Care Home Management;Biofeedback;Cryotherapy;Lobbyist  Stimulation;Moist Heat;Neuromuscular re-education;Therapeutic exercise;Therapeutic activities;Patient/family education;Manual techniques;Dry needling    PT Next Visit  Plan work in quadruped for manual work, hip IR in quadruped for stretch; see about coccyx and sacral mobility    PT Home Exercise Plan Access Code: [email or web address]    Consulted and Agree with Plan of Care Patient           Patient will benefit from skilled therapeutic intervention in order to improve the following deficits and impairments:  Decreased coordination,Increased fascial restricitons,Pain,Decreased activity tolerance,Decreased strength,Decreased mobility  Visit Diagnosis: Cramp and spasm  Muscle weakness (generalized)  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   [name], PT 10/06/20 8:44 AM   [facility] [address], [address] [place], [place], [address] Phone: [phone]   Fax:  [phone]  Name: Patrick Barr MRN: [id number] Date of Birth: 10-Apr-1966

## 2020-10-13 ENCOUNTER — Ambulatory Visit: Payer: Managed Care, Other (non HMO) | Admitting: Physical Therapy

## 2020-10-13 ENCOUNTER — Other Ambulatory Visit: Payer: Self-pay

## 2020-10-13 ENCOUNTER — Encounter: Payer: Self-pay | Admitting: Physical Therapy

## 2020-10-13 DIAGNOSIS — R252 Cramp and spasm: Secondary | ICD-10-CM

## 2020-10-13 DIAGNOSIS — M6281 Muscle weakness (generalized): Secondary | ICD-10-CM

## 2020-10-13 DIAGNOSIS — G8929 Other chronic pain: Secondary | ICD-10-CM

## 2020-10-13 NOTE — Therapy (Signed)
[facility] [address] [place], [place], [address] Phone: [phone]   Fax:  [phone]  Physical Therapy Treatment  Patient Details  Name: Patrick Barr MRN: [id number] Date of Birth: [birth year] Referring Provider (PT): Dr. Marcine Matar   Encounter Date: 10/13/2020   PT End of Session - 10/13/20 0838    Visit Number 6    Date for PT Re-Evaluation 11/13/20    Authorization Type [employer]    PT Start Time 0800    PT Stop Time 0838    PT Time Calculation (min) 38 min    Activity Tolerance Patient tolerated treatment well;No increased pain    Behavior During Therapy WFL for tasks assessed/performed           Past Medical History:  Diagnosis Date  . Arthritis   . Frequency of urination   . History of kidney stones   . Urgency of urination     Past Surgical History:  Procedure Laterality Date  . EXTRACORPOREAL SHOCK WAVE LITHOTRIPSY  2006  . HERNIA REPAIR Left 2018  . VASECTOMY Bilateral 05/15/2016   Procedure: VASECTOMY;  Surgeon: Sebastian Ache, MD;  Location: [facility];  Service: Urology;  Laterality: Bilateral;    There were no vitals filed for this visit.   Subjective Assessment - 10/13/20 0806    Subjective My wife has noticed I am not screaming in pain. I am not having as much pain with urination. Pain is 50% better since initial eval.    Patient Stated Goals decrease pain with urination,    Currently in Pain? Yes    Pain Score 3     Pain Location Pelvis   internal   Pain Orientation Mid    Pain Descriptors / Indicators Dull    Pain Type Chronic pain    Pain Onset More than a month ago    Pain Frequency Intermittent    Aggravating Factors  urination    Pain Relieving Factors sitting on seat for bike riding, sit on a ball at work    Multiple Pain Sites No                             [name] Treatment/Exercise - 10/13/20 0001      Exercises    Exercises Other Exercises    Other Exercises  sitting on 65cm ball to massage the pelvic floor with hip circles, figure 8, side to side, diagonals      Lumbar Exercises: Stretches   Piriformis Stretch Right;Left;1 rep    Piriformis Stretch Limitations with leg on high mat and therapist using the adadday to the hip and pelvic floor    Other Lumbar Stretch Exercise hip adductor stretch with leg on mat and move in diagonals with trunk rotation      Manual Therapy   Manual Therapy Soft tissue mobilization;Joint mobilization    Manual therapy comments with breath expanding the pelvic outlet    Joint Mobilization PA mobilization to sacrum; PA and rotational mobilization to T8-L5 grade 3    Soft tissue mobilization manual work tothe coccygeus with hip internal rotation                    PT Short Term Goals - 10/13/20 0841      PT SHORT TERM GOAL #1   Title independent with initial HEP    Time 4    Period  Weeks    Status Achieved      PT SHORT TERM GOAL #2   Title pain reduced by 25% with urination due to the ability to relax the pelvci floor    Time 4    Period Weeks    Status Achieved      PT SHORT TERM GOAL #3   Title Reduced voiding to 1x/hour on a bad day for improved function at work    Time 4    Period Weeks    Status Achieved             PT Long Term Goals - 10/06/20 0810      PT LONG TERM GOAL #1   Title right hip external rotation passively improved >/= 55 degrees to stretch the pelvic floor more effectively    Time 12    Status On-going      PT LONG TERM GOAL #2   Title Pt able to effectively use pelvic floor contractions and relaxation techniques to supress the urge to void    Baseline had some success    Time 12    Period Weeks    Status On-going      PT LONG TERM GOAL #3   Title improve pelvic floor soft tissue relaxation for reduction in pain by 75% with urination    Baseline 20% better    Time 12    Period Weeks    Status On-going       PT LONG TERM GOAL #4   Title pt able to void 1x every 2 hours at work for improved function and decreased distractions during his day    Baseline void from 120 min to 15 min    Time 12    Period Weeks    Status On-going      PT LONG TERM GOAL #5   Title Pt able to manage symptoms with advanced HEP    Time 12    Period Weeks    Status On-going                 Plan - 10/13/20 0839    Clinical Impression Statement Patient reports 50% less pain since initial evaluation. Patient ony had 1-2 bad days with increased pain since last visit. Patient pain level is 3/10 compared to 7/10. Patient has tightness in the right hip and pelvic floor more than the left. Patient will benefit from skilled therapy to relax his pelvis floor with urination and reduce his pain with urgency.    Personal Factors and Comorbidities Comorbidity 1;Past/Current Experience    Comorbidities left hernia repair, had therapy in the past    Examination-Activity Limitations Toileting    Examination-Participation Restrictions Community Activity    Stability/Clinical Decision Making Stable/Uncomplicated    Rehab Potential Good    PT Frequency 1x / week    PT Duration 12 weeks    PT Treatment/Interventions ADLs/Self Care Home Management;Biofeedback;Cryotherapy;Electrical Stimulation;Moist Heat;Neuromuscular re-education;Therapeutic exercise;Therapeutic activities;Patient/family education;Manual techniques;Dry needling    PT Next Visit Plan work in quadruped for manual work, hip IR in quadruped for stretch; see about coccyx and sacral mobility; ask about urgency    PT Home Exercise Plan Access Code: [email or web address]    Consulted and Agree with Plan of Care Patient           Patient will benefit from skilled therapeutic intervention in order to improve the following deficits and impairments:  Decreased coordination,Increased fascial restricitons,Pain,Decreased activity tolerance,Decreased strength,Decreased mobility  Visit  Diagnosis: Cramp and  spasm  Muscle weakness (generalized)  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   [name], PT 10/13/20 8:42 AM   [facility] [address], [address] [place], [place], [address] Phone: [phone]   Fax:  [phone]  Name: Patrick Barr MRN: [id number] Date of Birth: 1966/06/28

## 2020-11-03 ENCOUNTER — Ambulatory Visit: Payer: Managed Care, Other (non HMO) | Attending: Urology | Admitting: Physical Therapy

## 2020-11-03 ENCOUNTER — Encounter: Payer: Self-pay | Admitting: Physical Therapy

## 2020-11-03 ENCOUNTER — Other Ambulatory Visit: Payer: Self-pay

## 2020-11-03 DIAGNOSIS — R252 Cramp and spasm: Secondary | ICD-10-CM | POA: Insufficient documentation

## 2020-11-03 DIAGNOSIS — G8929 Other chronic pain: Secondary | ICD-10-CM | POA: Insufficient documentation

## 2020-11-03 DIAGNOSIS — M6281 Muscle weakness (generalized): Secondary | ICD-10-CM | POA: Diagnosis present

## 2020-11-03 DIAGNOSIS — R102 Pelvic and perineal pain: Secondary | ICD-10-CM | POA: Insufficient documentation

## 2020-11-03 NOTE — Therapy (Signed)
[facility] [address] [place], [place], [address] Phone: [phone]   Fax:  [phone]  Physical Therapy Treatment  Patient Details  Name: Patrick Barr MRN: [id number] Date of Birth: January 16, 1966 Referring Provider (PT): Dr. Marcine Matar   Encounter Date: 11/03/2020   PT End of Session - 11/03/20 0842    Visit Number 7    Date for PT Re-Evaluation 11/13/20    Authorization Type [employer]    PT Start Time 0807   came late   PT Stop Time 0840    PT Time Calculation (min) 33 min    Activity Tolerance Patient tolerated treatment well;No increased pain    Behavior During Therapy WFL for tasks assessed/performed           Past Medical History:  Diagnosis Date  . Arthritis   . Frequency of urination   . History of kidney stones   . Urgency of urination     Past Surgical History:  Procedure Laterality Date  . EXTRACORPOREAL SHOCK WAVE LITHOTRIPSY  2006  . HERNIA REPAIR Left 2018  . VASECTOMY Bilateral 05/15/2016   Procedure: VASECTOMY;  Surgeon: Sebastian Ache, MD;  Location: [facility];  Service: Urology;  Laterality: Bilateral;    There were no vitals filed for this visit.   Subjective Assessment - 11/03/20 0807    Subjective Sitting on the small ball helps. The home work chair is more comfortable.    Patient Stated Goals decrease pain with urination,    Pain Score 4     Pain Location Pelvis   internal   Pain Orientation Mid    Pain Descriptors / Indicators Dull    Pain Type Chronic pain    Pain Onset More than a month ago    Pain Frequency Intermittent    Aggravating Factors  urination    Pain Relieving Factors sitting on seat for bike riding, sit on a ball at work    Multiple Pain Sites No                             [name] Treatment/Exercise - 11/03/20 0001      Self-Care   Self-Care Other Self-Care Comments    Other Self-Care Comments  dicussed  the difference of work and home chair. Patient does better with a chair without a scoop and how to see about a chair for work that fits better.discussed the urge to void and adding heel raises to calm the nervous system, and watch the you tube videos to take his mind off the urge.      Therapeutic Activites    Therapeutic Activities Other Therapeutic Activities    Other Therapeutic Activities discussed on sitting position and how to roll towels to prevent the pelvis to post. tilt      Exercises   Exercises Other Exercises    Other Exercises  sitting on 65cm ball to massage the pelvic floor with hip circles, figure 8, side to side, diagonals      Manual Therapy   Manual Therapy Soft tissue mobilization;Joint mobilization    Joint Mobilization PA mobilization to L1-L5 grade 3; anterior movement of lumbar with prone press ups    Soft tissue mobilization when in quadruped with hips internally rotated using the addaday to the levator ani and coccygeus then patient in standing with leg on high mat to stretch the piriformis and use the addaday  to the same area on the right then left                    PT Short Term Goals - 10/13/20 0841      PT SHORT TERM GOAL #1   Title independent with initial HEP    Time 4    Period Weeks    Status Achieved      PT SHORT TERM GOAL #2   Title pain reduced by 25% with urination due to the ability to relax the pelvci floor    Time 4    Period Weeks    Status Achieved      PT SHORT TERM GOAL #3   Title Reduced voiding to 1x/hour on a bad day for improved function at work    Time 4    Period Weeks    Status Achieved             PT Long Term Goals - 10/06/20 0810      PT LONG TERM GOAL #1   Title right hip external rotation passively improved >/= 55 degrees to stretch the pelvic floor more effectively    Time 12    Status On-going      PT LONG TERM GOAL #2   Title Pt able to effectively use pelvic floor contractions and relaxation  techniques to supress the urge to void    Baseline had some success    Time 12    Period Weeks    Status On-going      PT LONG TERM GOAL #3   Title improve pelvic floor soft tissue relaxation for reduction in pain by 75% with urination    Baseline 20% better    Time 12    Period Weeks    Status On-going      PT LONG TERM GOAL #4   Title pt able to void 1x every 2 hours at work for improved function and decreased distractions during his day    Baseline void from 120 min to 15 min    Time 12    Period Weeks    Status On-going      PT LONG TERM GOAL #5   Title Pt able to manage symptoms with advanced HEP    Time 12    Period Weeks    Status On-going                 Plan - 11/03/20 0843    Clinical Impression Statement Patient has been back at work and notices an increase in urges. Patient reports his seat at work will slop and have him slide forward. Discussed with patient on ways to change his chair at work to reduce posterior tilt and to see if work will get him a new chair. Pateint reports he is not noticing the pain as much. Patient still has tightness in the pelvic floor. He is using the small ball to massage the pelvic floor and it is helping. Patient will benefit from skilled therapy to relax his pelvic floor with urination and reduce his pain and urgency.    Personal Factors and Comorbidities Comorbidity 1;Past/Current Experience    Comorbidities left hernia repair, had therapy in the past    Examination-Activity Limitations Toileting    Examination-Participation Restrictions Community Activity    Stability/Clinical Decision Making Stable/Uncomplicated    Rehab Potential Good    PT Frequency 1x / week    PT Duration 12 weeks    PT  Treatment/Interventions ADLs/Self Care Home Management;Biofeedback;Cryotherapy;Electrical Stimulation;Moist Heat;Neuromuscular re-education;Therapeutic exercise;Therapeutic activities;Patient/family education;Manual techniques;Dry needling     PT Next Visit Plan work in quadruped for manual work, hip IR in quadruped for stretch; see about coccyx and sacral mobility; write renewal    PT Home Exercise Plan Access Code: [email or web address]    Consulted and Agree with Plan of Care Patient           Patient will benefit from skilled therapeutic intervention in order to improve the following deficits and impairments:  Decreased coordination,Increased fascial restricitons,Pain,Decreased activity tolerance,Decreased strength,Decreased mobility  Visit Diagnosis: Cramp and spasm  Muscle weakness (generalized)  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   [name], PT 11/03/20 8:47 AM   [facility] [address], [address] [place], [place], [address] Phone: [phone]   Fax:  [phone]  Name: Patrick Barr MRN: [id number] Date of Birth: 08/10/1966

## 2020-11-10 ENCOUNTER — Ambulatory Visit: Payer: Managed Care, Other (non HMO) | Admitting: Physical Therapy

## 2020-11-17 ENCOUNTER — Ambulatory Visit: Payer: Managed Care, Other (non HMO) | Admitting: Physical Therapy

## 2020-11-17 ENCOUNTER — Other Ambulatory Visit: Payer: Self-pay

## 2020-11-17 ENCOUNTER — Encounter: Payer: Self-pay | Admitting: Physical Therapy

## 2020-11-17 DIAGNOSIS — R252 Cramp and spasm: Secondary | ICD-10-CM

## 2020-11-17 DIAGNOSIS — G8929 Other chronic pain: Secondary | ICD-10-CM

## 2020-11-17 DIAGNOSIS — M6281 Muscle weakness (generalized): Secondary | ICD-10-CM

## 2020-11-17 NOTE — Therapy (Signed)
[facility] [address] [place], [place], [address] Phone: [phone]   Fax:  [phone]  Physical Therapy Treatment  Patient Details  Name: Patrick Barr MRN: [id number] Date of Birth: 05/26/1966 Referring Provider (PT): Dr. Marcine Matar   Encounter Date: 11/17/2020   PT End of Session - 11/17/20 0842    Visit Number 8    Date for PT Re-Evaluation 02/05/21    Authorization Type [employer]    PT Start Time 0800    PT Stop Time 0840    PT Time Calculation (min) 40 min    Activity Tolerance Patient tolerated treatment well;No increased pain    Behavior During Therapy WFL for tasks assessed/performed           Past Medical History:  Diagnosis Date   Arthritis    Frequency of urination    History of kidney stones    Urgency of urination     Past Surgical History:  Procedure Laterality Date   EXTRACORPOREAL SHOCK WAVE LITHOTRIPSY  2006   HERNIA REPAIR Left 2018   VASECTOMY Bilateral 05/15/2016   Procedure: VASECTOMY;  Surgeon: Sebastian Ache, MD;  Location: [facility];  Service: Urology;  Laterality: Bilateral;    There were no vitals filed for this visit.   Subjective Assessment - 11/17/20 0807    Subjective Alot of my pain is stress related. Bouncng heels to deter the urge to urinate makes it worse.    Patient Stated Goals decrease pain with urination,    Currently in Pain? Yes    Pain Score 6     Pain Location Pelvis    Pain Orientation Mid    Pain Descriptors / Indicators Dull    Pain Type Chronic pain    Pain Onset More than a month ago    Pain Frequency Intermittent    Aggravating Factors  when has to go to the bathroom, going past the urgency    Pain Relieving Factors sitting on seat for bike riding, sit on a ball at work              [facility] PT Assessment - 11/17/20 0001      Assessment   Medical Diagnosis Urinary frequency and urgency    Referring Provider  (PT) Dr. Marcine Matar    Onset Date/Surgical Date --   3 years     Precautions   Precautions None      Restrictions   Weight Bearing Restrictions No      Home Environment   Living Environment Private residence      Prior Function   Vocation Full time employment    Vocation Requirements sitting, back in the office,     Leisure biking outside,       Cognition   Overall Cognitive Status Within Functional Limits for tasks assessed      AROM   Lumbar Flexion full going to the right      PROM   Right Hip External Rotation  40   after mobilization 50 degrees   Left Hip External Rotation  40   after manual increased to 50 degrees                        [name] Treatment/Exercise - 11/17/20 0001      Self-Care   Self-Care Other Self-Care Comments    Other Self-Care Comments  discussed other ways to deter the urge to urinate  with mantras and the other techniques he has learned.      Exercises   Exercises Other Exercises    Other Exercises  sitting on 65cm ball to massage the pelvic floor with hip circles, figure 8, side to side, diagonals      Lumbar Exercises: Stretches   Piriformis Stretch Right;Left;1 rep;30 seconds    Piriformis Stretch Limitations supine      Knee/Hip Exercises: Prone   Other Prone Exercises prone push heels together then move heels apart for contract relax to increase hip IR      Manual Therapy   Manual Therapy Joint mobilization    Joint Mobilization using mulligan belt to mobilize the hips to improve hip ER                  PT Education - 11/17/20 0842    Education Details Access Code: [email or web address]    Person(s) Educated Patient    Methods Explanation;Demonstration;Verbal cues;Handout    Comprehension Returned demonstration;Verbalized understanding            PT Short Term Goals - 10/13/20 0841      PT SHORT TERM GOAL #1   Title independent with initial HEP    Time 4    Period Weeks    Status Achieved       PT SHORT TERM GOAL #2   Title pain reduced by 25% with urination due to the ability to relax the pelvci floor    Time 4    Period Weeks    Status Achieved      PT SHORT TERM GOAL #3   Title Reduced voiding to 1x/hour on a bad day for improved function at work    Time 4    Period Weeks    Status Achieved             PT Long Term Goals - 11/17/20 0831      PT LONG TERM GOAL #1   Title right hip external rotation passively improved >/= 55 degrees to stretch the pelvic floor more effectively    Time 12    Period Weeks    Status On-going      PT LONG TERM GOAL #2   Title Pt able to effectively use pelvic floor contractions and relaxation techniques to supress the urge to void    Baseline had some success    Time 12    Period Weeks    Status On-going      PT LONG TERM GOAL #3   Title improve pelvic floor soft tissue relaxation for reduction in pain by 75% with urination    Baseline 40% better    Time 12    Period Weeks    Status On-going      PT LONG TERM GOAL #4   Title pt able to void 1x every 2 hours at work for improved function and decreased distractions during his day    Baseline void from 120 min to 15 min    Time 12    Period Weeks    Status On-going      PT LONG TERM GOAL #5   Title Pt able to manage symptoms with advanced HEP    Time 12    Period Weeks    Status On-going                 Plan - 11/17/20 0843    Clinical Impression Statement Patient reports his discomfort is 40% better. He is understanding  ways to manage his pain and stress to reduce the muscle tension of the pelvic floor. Patient bilateral hip rotation externally was 40 degrees and after manual work increased to 50 degrees. Patient is independent with his current HEP and is consistent with it. Patient has been working at home more often and does better due to his chair, reduced stress level, and proximity of the commode. Patient still has frequent urination. If he delays the  urination his pain will increase. Patient will benefit from skilled therapy to relax his pelvic floor with urination and reduce his pain and urgency.    Personal Factors and Comorbidities Comorbidity 1;Past/Current Experience    Comorbidities left hernia repair, had therapy in the past    Examination-Activity Limitations Toileting    Examination-Participation Restrictions Community Activity    Stability/Clinical Decision Making Stable/Uncomplicated    Rehab Potential Good    PT Frequency 1x / week    PT Duration 12 weeks    PT Treatment/Interventions ADLs/Self Care Home Management;Biofeedback;Cryotherapy;Electrical Stimulation;Moist Heat;Neuromuscular re-education;Therapeutic exercise;Therapeutic activities;Patient/family education;Manual techniques;Dry needling    PT Next Visit Plan work in quadruped for manual work, hip IR in quadruped for stretch; see about coccyx and sacral mobility;    PT Home Exercise Plan Access Code: [email or web address]    Recommended Other Services sent renewal    Consulted and Agree with Plan of Care Patient           Patient will benefit from skilled therapeutic intervention in order to improve the following deficits and impairments:  Decreased coordination,Increased fascial restricitons,Pain,Decreased activity tolerance,Decreased strength,Decreased mobility  Visit Diagnosis: Cramp and spasm - Plan: PT plan of care cert/re-cert  Muscle weakness (generalized) - Plan: PT plan of care cert/re-cert  Perineal pain in male, chronic - Plan: PT plan of care cert/re-cert     Problem List There are no problems to display for this patient.   [name], PT 11/17/20 8:49 AM   [facility] [address], [address] [place], [place], [address] Phone: [phone]   Fax:  [phone]  Name: Patrick A Troup MRN: [id number] Date of Birth: Nov 16, 1965

## 2020-11-17 NOTE — Patient Instructions (Signed)
Access Code: [email or web address] URL: [email or web address] Date: 11/17/2020 Prepared by: [name]  Exercises Prone Heel Squeeze - 1 x daily - 7 x weekly - 1 sets - 10 reps - 5 sec hold Supine Piriformis Stretch Pulling Heel to Hip - 1 x daily - 7 x weekly - 1 sets - 2 reps - 30 sec hold [facility] [address] [place], [place] [address] Phone # [phone] Fax [phone]

## 2020-11-24 ENCOUNTER — Other Ambulatory Visit: Payer: Self-pay

## 2020-11-24 ENCOUNTER — Encounter: Payer: Self-pay | Admitting: Physical Therapy

## 2020-11-24 ENCOUNTER — Ambulatory Visit: Payer: Managed Care, Other (non HMO) | Admitting: Physical Therapy

## 2020-11-24 DIAGNOSIS — M6281 Muscle weakness (generalized): Secondary | ICD-10-CM

## 2020-11-24 DIAGNOSIS — R252 Cramp and spasm: Secondary | ICD-10-CM

## 2020-11-24 DIAGNOSIS — R102 Pelvic and perineal pain: Secondary | ICD-10-CM

## 2020-11-24 DIAGNOSIS — G8929 Other chronic pain: Secondary | ICD-10-CM

## 2020-11-24 NOTE — Therapy (Signed)
[facility] [address], [address] [place], [place], [address] Phone: [phone]   Fax:  [phone]  Physical Therapy Treatment  Patient Details  Name: Patrick Barr MRN: [id number] Date of Birth: [birth year] Referring Provider (PT): Dr. Marcine Matar   Encounter Date: 11/24/2020   PT End of Session - 11/24/20 0813    Visit Number 9    Date for PT Re-Evaluation 02/05/21    Authorization Type [employer]    PT Start Time 0806    PT Stop Time 0845    PT Time Calculation (min) 39 min    Activity Tolerance Patient tolerated treatment well;No increased pain    Behavior During Therapy WFL for tasks assessed/performed           Past Medical History:  Diagnosis Date  . Arthritis   . Frequency of urination   . History of kidney stones   . Urgency of urination     Past Surgical History:  Procedure Laterality Date  . EXTRACORPOREAL SHOCK WAVE LITHOTRIPSY  2006  . HERNIA REPAIR Left 2018  . VASECTOMY Bilateral 05/15/2016   Procedure: VASECTOMY;  Surgeon: Sebastian Ache, MD;  Location: [facility];  Service: Urology;  Laterality: Bilateral;    There were no vitals filed for this visit.   Subjective Assessment - 11/24/20 0806    Subjective I am back in the office. Tues to Thursday from 11:30-3:00 I had to go to the bathroom 15-20 minutes and 5 times over lunch.    Patient Stated Goals decrease pain with urination,    Currently in Pain? Yes    Pain Score 6     Pain Location Pelvis    Pain Orientation Mid    Pain Descriptors / Indicators Dull    Pain Type Chronic pain    Pain Onset More than a month ago    Pain Frequency Intermittent    Aggravating Factors  when has to go to the bathroom, going past the urgency    Pain Relieving Factors sitting on seat for bike riding, sit on a ball at work    Multiple Pain Sites No              [facility] PT Assessment - 11/24/20 0001      Palpation   SI assessment   sacrum rotated rotated right                         [facility] Adult PT Treatment/Exercise - 11/24/20 0001      Manual Therapy   Manual Therapy Myofascial release;Joint mobilization;Soft tissue mobilization    Manual therapy comments educated patient on how to perform soft tissue mobilization to the abdomen    Joint Mobilization PA and rotational mobilization to T4-L5; Sacral mobilization to correct right rotation; bilateral posterior rib mobilization to improve mobility    Soft tissue mobilization manual work to the thoracic, lumbar in prone, quadruped pulling the tissue from the back to the abdoment; supine manual mobilization to the diaphgram and abdominal musculature and superior to the pubic bone    Myofascial Release using a suction cup to release the fascial along the low thoracic, lumbar and abdominal region, tisue rollingof the abdomen                  PT Education - 11/24/20 0846    Education Details educated patient on manual work to the abdomen and superior pubic bone area  Person(s) Educated Patient    Methods Explanation;Demonstration    Comprehension Verbalized understanding;Returned demonstration            PT Short Term Goals - 10/13/20 0841      PT SHORT TERM GOAL #1   Title independent with initial HEP    Time 4    Period Weeks    Status Achieved      PT SHORT TERM GOAL #2   Title pain reduced by 25% with urination due to the ability to relax the pelvci floor    Time 4    Period Weeks    Status Achieved      PT SHORT TERM GOAL #3   Title Reduced voiding to 1x/hour on a bad day for improved function at work    Time 4    Period Weeks    Status Achieved             PT Long Term Goals - 11/17/20 0831      PT LONG TERM GOAL #1   Title right hip external rotation passively improved >/= 55 degrees to stretch the pelvic floor more effectively    Time 12    Period Weeks    Status On-going      PT LONG TERM GOAL #2   Title Pt  able to effectively use pelvic floor contractions and relaxation techniques to supress the urge to void    Baseline had some success    Time 12    Period Weeks    Status On-going      PT LONG TERM GOAL #3   Title improve pelvic floor soft tissue relaxation for reduction in pain by 75% with urination    Baseline 40% better    Time 12    Period Weeks    Status On-going      PT LONG TERM GOAL #4   Title pt able to void 1x every 2 hours at work for improved function and decreased distractions during his day    Baseline void from 120 min to 15 min    Time 12    Period Weeks    Status On-going      PT LONG TERM GOAL #5   Title Pt able to manage symptoms with advanced HEP    Time 12    Period Weeks    Status On-going                 Plan - 11/24/20 0814    Clinical Impression Statement Patient pain seems to increase when he is at work due to stress, different chair he is sitting on, and no always able to get to the bathroom. Patient sacrum was corrected after manual work and improved thoracic and lumbar mobility. Patient has increased tightness in the abdominal musculature and suprapubically. Patient was educated on how to mobilize the abdominal and suprapubic area. When patient urinates at work his pain starts suprapubically and will last for 4 hours. Patient is able to work on managing his pain better at home than work. Patient will benefit from skilled therapy to relax his pelvic floor with urination and reduce his pain and urgency.    Personal Factors and Comorbidities Comorbidity 1;Past/Current Experience    Comorbidities left hernia repair, had therapy in the past    Examination-Activity Limitations Toileting    Examination-Participation Restrictions Community Activity    Rehab Potential Good    PT Frequency 1x / week    PT Duration 12 weeks  PT Treatment/Interventions ADLs/Self Care Home Management;Biofeedback;Cryotherapy;Electrical Stimulation;Moist Heat;Neuromuscular  re-education;Therapeutic exercise;Therapeutic activities;Patient/family education;Manual techniques;Dry needling    PT Next Visit Plan work on the abdominal tissue and suprapubically and the diaphgram; work on diaphram mobility with breath and teach self mobilitzation; mobilization to sacrum and lumbar and thoracic spine    PT Home Exercise Plan Access Code: [email or web address]    Recommended Other Services MD signed all notes    Consulted and Agree with Plan of Care Patient           Patient will benefit from skilled therapeutic intervention in order to improve the following deficits and impairments:  Decreased coordination,Increased fascial restricitons,Pain,Decreased activity tolerance,Decreased strength,Decreased mobility  Visit Diagnosis: Cramp and spasm  Muscle weakness (generalized)  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   [name], PT 11/24/20 9:41 AM   [facility] [address], [address] [place], [place], [address] Phone: [phone]   Fax:  [phone]  Name: Patrick A Carll MRN: [id number] Date of Birth: April 13, 1966

## 2020-11-30 ENCOUNTER — Other Ambulatory Visit: Payer: Self-pay

## 2020-11-30 ENCOUNTER — Ambulatory Visit: Payer: Managed Care, Other (non HMO) | Attending: Urology | Admitting: Physical Therapy

## 2020-11-30 ENCOUNTER — Encounter: Payer: Self-pay | Admitting: Physical Therapy

## 2020-11-30 DIAGNOSIS — R252 Cramp and spasm: Secondary | ICD-10-CM | POA: Diagnosis not present

## 2020-11-30 DIAGNOSIS — M6281 Muscle weakness (generalized): Secondary | ICD-10-CM | POA: Insufficient documentation

## 2020-11-30 DIAGNOSIS — G8929 Other chronic pain: Secondary | ICD-10-CM | POA: Diagnosis present

## 2020-11-30 DIAGNOSIS — R102 Pelvic and perineal pain: Secondary | ICD-10-CM | POA: Diagnosis present

## 2020-11-30 NOTE — Therapy (Signed)
[facility] [address] [place], [place], [address] Phone: [phone]   Fax:  [phone]  Physical Therapy Treatment  Patient Details  Name: Patrick Barr MRN: [id number] Date of Birth: Mar 15, 1966 Referring Provider (PT): Dr. Marcine Matar   Encounter Date: 11/30/2020   PT End of Session - 11/30/20 1529    Visit Number 10    Date for PT Re-Evaluation 02/05/21    Authorization Type [employer]    PT Start Time 1445    PT Stop Time 1523    PT Time Calculation (min) 38 min    Activity Tolerance Patient tolerated treatment well;No increased pain    Behavior During Therapy WFL for tasks assessed/performed           Past Medical History:  Diagnosis Date  . Arthritis   . Frequency of urination   . History of kidney stones   . Urgency of urination     Past Surgical History:  Procedure Laterality Date  . EXTRACORPOREAL SHOCK WAVE LITHOTRIPSY  2006  . HERNIA REPAIR Left 2018  . VASECTOMY Bilateral 05/15/2016   Procedure: VASECTOMY;  Surgeon: Sebastian Ache, MD;  Location: [facility];  Service: Urology;  Laterality: Bilateral;    There were no vitals filed for this visit.   Subjective Assessment - 11/30/20 1446    Subjective Tuesday and Wednesday were awful and had back to back urges. The massage technique helps with the post pain.    Patient Stated Goals decrease pain with urination,    Currently in Pain? Yes    Pain Score 6     Pain Location Pelvis    Pain Orientation Mid    Pain Descriptors / Indicators Dull    Pain Type Chronic pain    Pain Onset More than a month ago    Pain Frequency Intermittent    Aggravating Factors  when has to go to the bathroom, going past the urgency    Pain Relieving Factors sitting on seat for bike riding, sit on a ball at work    Multiple Pain Sites No                          Pelvic Floor Special Questions - 11/30/20 0001    Biofeedback  supine diaphragmatic breathing to relax the pelvic floor, using hot pack on hands; therpist used the perineal hyperactive protocol; used tactile cues to open up the lower rib cage and touching the perinal body so the patient understands where to drop the pelvic floor. Patietn prelvic floro would not go lower than 10 uv during the treatment session    Biofeedback sensor type Surface   rectal            [name] Treatment/Exercise - 11/30/20 0001      Self-Care   Self-Care Other Self-Care Comments    Other Self-Care Comments  discussed tibial nerve stimulation for overactive bladder                    PT Short Term Goals - 10/13/20 0841      PT SHORT TERM GOAL #1   Title independent with initial HEP    Time 4    Period Weeks    Status Achieved      PT SHORT TERM GOAL #2   Title pain reduced by 25% with urination due to the ability to relax the pelvci floor  Time 4    Period Weeks    Status Achieved      PT SHORT TERM GOAL #3   Title Reduced voiding to 1x/hour on a bad day for improved function at work    Time 4    Period Weeks    Status Achieved             PT Long Term Goals - 11/30/20 1534      PT LONG TERM GOAL #1   Title right hip external rotation passively improved >/= 55 degrees to stretch the pelvic floor more effectively    Time 12    Status On-going      PT LONG TERM GOAL #2   Title Pt able to effectively use pelvic floor contractions and relaxation techniques to supress the urge to void    Baseline had some success    Time 12    Period Weeks    Status On-going      PT LONG TERM GOAL #3   Title improve pelvic floor soft tissue relaxation for reduction in pain by 75% with urination    Baseline 40% better    Time 12    Period Weeks    Status On-going      PT LONG TERM GOAL #4   Title pt able to void 1x every 2 hours at work for improved function and decreased distractions during his day    Baseline void from 120 min to 15 min     Time 12    Period Weeks    Status On-going      PT LONG TERM GOAL #5   Title Pt able to manage symptoms with advanced HEP    Time 12    Period Weeks    Status On-going                 Plan - 11/30/20 1530    Clinical Impression Statement Patient has trouble with relaxing the pelvic floor. The resting level stays at 10 uv with breath work. Therapist can feel the pelvic floor drop with his breath but the EMG shows the resting level staying at 10 uv. Patient will have a constant urge to urinate so his pelvic floor will stay in a constant elevated state. Patient urge to void is more prevelent at work. Patient was educated on tibial nerve stimulation and will try at next visit.    Personal Factors and Comorbidities Comorbidity 1;Past/Current Experience    Comorbidities left hernia repair, had therapy in the past    Examination-Activity Limitations Toileting    Examination-Participation Restrictions Community Activity    Stability/Clinical Decision Making Stable/Uncomplicated    Rehab Potential Good    PT Frequency 1x / week    PT Duration 12 weeks    PT Treatment/Interventions ADLs/Self Care Home Management;Biofeedback;Cryotherapy;Electrical Stimulation;Moist Heat;Neuromuscular re-education;Therapeutic exercise;Therapeutic activities;Patient/family education;Manual techniques;Dry needling    PT Next Visit Plan try tibial nerve stimulation for relaxation of the pelvic floor    PT Home Exercise Plan Access Code: [email or web address]    Consulted and Agree with Plan of Care Patient           Patient will benefit from skilled therapeutic intervention in order to improve the following deficits and impairments:  Decreased coordination,Increased fascial restricitons,Pain,Decreased activity tolerance,Decreased strength,Decreased mobility  Visit Diagnosis: Cramp and spasm  Muscle weakness (generalized)  Perineal pain in male, chronic     Problem List There are no problems to display for this  patient.   Elnita Maxwell  Wallace Cullens, PT 11/30/20 3:35 PM   [facility] [address] [place], [place], [address] Phone: [phone]   Fax:  [phone]  Name: Patrick Barr MRN: [id number] Date of Birth: 1966-08-11

## 2020-12-06 ENCOUNTER — Ambulatory Visit: Payer: Managed Care, Other (non HMO) | Admitting: Physical Therapy

## 2020-12-06 ENCOUNTER — Encounter: Payer: Self-pay | Admitting: Physical Therapy

## 2020-12-06 ENCOUNTER — Other Ambulatory Visit: Payer: Self-pay

## 2020-12-06 DIAGNOSIS — M6281 Muscle weakness (generalized): Secondary | ICD-10-CM

## 2020-12-06 DIAGNOSIS — R102 Pelvic and perineal pain: Secondary | ICD-10-CM

## 2020-12-06 DIAGNOSIS — G8929 Other chronic pain: Secondary | ICD-10-CM

## 2020-12-06 DIAGNOSIS — R252 Cramp and spasm: Secondary | ICD-10-CM | POA: Diagnosis not present

## 2020-12-06 NOTE — Therapy (Signed)
[facility] [address], [address] [place], [place], [address] Phone: [phone]   Fax:  [phone]  Physical Therapy Treatment  Patient Details  Name: Patrick Barr MRN: [id number] Date of Birth: November 17, 1965 Referring Provider (PT): Dr. Marcine Matar   Encounter Date: 12/06/2020   PT End of Session - 12/06/20 1229    Visit Number 11    Date for PT Re-Evaluation 02/05/21    Authorization Type [employer]    PT Start Time 0845    PT Stop Time 0925    PT Time Calculation (min) 40 min    Activity Tolerance Patient tolerated treatment well;No increased pain    Behavior During Therapy WFL for tasks assessed/performed           Past Medical History:  Diagnosis Date  . Arthritis   . Frequency of urination   . History of kidney stones   . Urgency of urination     Past Surgical History:  Procedure Laterality Date  . EXTRACORPOREAL SHOCK WAVE LITHOTRIPSY  2006  . HERNIA REPAIR Left 2018  . VASECTOMY Bilateral 05/15/2016   Procedure: VASECTOMY;  Surgeon: Sebastian Ache, MD;  Location: [facility];  Service: Urology;  Laterality: Bilateral;    There were no vitals filed for this visit.   Subjective Assessment - 12/06/20 0850    Subjective My pain is alot better in general. Last week the pain has been better. I worked from home.    Patient Stated Goals decrease pain with urination,    Currently in Pain? Yes    Pain Score 3     Pain Location Pelvis    Pain Orientation Mid    Pain Descriptors / Indicators Dull    Pain Type Chronic pain    Pain Onset More than a month ago    Pain Frequency Intermittent    Aggravating Factors  when has to go to the bathroom, going past the urgency    Pain Relieving Factors sitting on seat for bick riding, sit on a ball at work                          Pelvic Floor Special Questions - 12/06/20 0001    Pelvic Floor Internal Exam Patient confirms  identification and approves pelvic floor assessment and treatment    Exam Type Rectal             [name] Treatment/Exercise - 12/06/20 0001      Self-Care   Self-Care Other Self-Care Comments    Other Self-Care Comments  education on the pelvic floor anatomy, why manual work may help with the urges and about stiulation of the tibial nerve as another option to work on the urges      Manual Therapy   Manual Therapy Internal Pelvic Floor    Internal Pelvic Floor left sidely manual mobilization to bil.levator ani, urethra spincter, puborectalis insertion, fascial release to the sides of the bladder and urethra with release felt und the therapist finger and patient felt the releases                    PT Short Term Goals - 10/13/20 0841      PT SHORT TERM GOAL #1   Title independent with initial HEP    Time 4    Period Weeks    Status Achieved      PT SHORT TERM GOAL #2  Title pain reduced by 25% with urination due to the ability to relax the pelvci floor    Time 4    Period Weeks    Status Achieved      PT SHORT TERM GOAL #3   Title Reduced voiding to 1x/hour on a bad day for improved function at work    Time 4    Period Weeks    Status Achieved             PT Long Term Goals - 11/30/20 1534      PT LONG TERM GOAL #1   Title right hip external rotation passively improved >/= 55 degrees to stretch the pelvic floor more effectively    Time 12    Status On-going      PT LONG TERM GOAL #2   Title Pt able to effectively use pelvic floor contractions and relaxation techniques to supress the urge to void    Baseline had some success    Time 12    Period Weeks    Status On-going      PT LONG TERM GOAL #3   Title improve pelvic floor soft tissue relaxation for reduction in pain by 75% with urination    Baseline 40% better    Time 12    Period Weeks    Status On-going      PT LONG TERM GOAL #4   Title pt able to void 1x every 2 hours at work for  improved function and decreased distractions during his day    Baseline void from 120 min to 15 min    Time 12    Period Weeks    Status On-going      PT LONG TERM GOAL #5   Title Pt able to manage symptoms with advanced HEP    Time 12    Period Weeks    Status On-going                 Plan - 12/06/20 1229    Clinical Impression Statement Patient reports he is improving and his pain is 3/10 compared to 6/10. Patient had many fascial releases around the urethra sphincter, sides of the bladder and urethra and levator ani. Patient felt increased relaxation after the manual work. Patient approves if we need to do the manual work several times. Patient will benefit from skilled therapy to improve muscle tension and reduction of the urges to urinate while reducing the pain.    Personal Factors and Comorbidities Comorbidity 1;Past/Current Experience    Comorbidities left hernia repair, had therapy in the past    Examination-Activity Limitations Toileting    Examination-Participation Restrictions Community Activity    Stability/Clinical Decision Making Stable/Uncomplicated    Rehab Potential Good    PT Frequency 1x / week    PT Duration 12 weeks    PT Treatment/Interventions ADLs/Self Care Home Management;Biofeedback;Cryotherapy;Electrical Stimulation;Moist Heat;Neuromuscular re-education;Therapeutic exercise;Therapeutic activities;Patient/family education;Manual techniques;Dry needling    PT Next Visit Plan assess pelvic pain and see if manual is to be done again or the tibial stimuation    PT Home Exercise Plan Access Code: [email or web address]    Consulted and Agree with Plan of Care Patient           Patient will benefit from skilled therapeutic intervention in order to improve the following deficits and impairments:  Decreased coordination,Increased fascial restricitons,Pain,Decreased activity tolerance,Decreased strength,Decreased mobility  Visit Diagnosis: Cramp and spasm  Muscle  weakness (generalized)  Perineal pain in male, chronic  Problem List There are no problems to display for this patient.   [name], PT 12/06/20 12:32 PM    [facility] [address] [place], [place], [address] Phone: [phone]   Fax:  [phone]  Name: Patrick A Peak MRN: [id number] Date of Birth: April 15, 1966

## 2020-12-22 ENCOUNTER — Other Ambulatory Visit: Payer: Self-pay

## 2020-12-22 ENCOUNTER — Ambulatory Visit: Payer: Managed Care, Other (non HMO) | Admitting: Physical Therapy

## 2020-12-22 ENCOUNTER — Encounter: Payer: Self-pay | Admitting: Physical Therapy

## 2020-12-22 DIAGNOSIS — M6281 Muscle weakness (generalized): Secondary | ICD-10-CM

## 2020-12-22 DIAGNOSIS — R252 Cramp and spasm: Secondary | ICD-10-CM

## 2020-12-22 DIAGNOSIS — R102 Pelvic and perineal pain: Secondary | ICD-10-CM

## 2020-12-22 NOTE — Therapy (Signed)
[facility] [address], [address] [place], [place], [address] Phone: [phone]   Fax:  [phone]  Physical Therapy Treatment  Patient Details  Name: Patrick Barr MRN: [id number] Date of Birth: [birth year] Referring Provider (PT): Dr. Marcine Matar   Encounter Date: 12/22/2020   PT End of Session - 12/22/20 0929    Visit Number 12    Date for PT Re-Evaluation 02/05/21    Authorization Type [employer]    PT Start Time 0845    PT Stop Time 0928    PT Time Calculation (min) 43 min    Activity Tolerance Patient tolerated treatment well;No increased pain    Behavior During Therapy WFL for tasks assessed/performed           Past Medical History:  Diagnosis Date  . Arthritis   . Frequency of urination   . History of kidney stones   . Urgency of urination     Past Surgical History:  Procedure Laterality Date  . EXTRACORPOREAL SHOCK WAVE LITHOTRIPSY  2006  . HERNIA REPAIR Left 2018  . VASECTOMY Bilateral 05/15/2016   Procedure: VASECTOMY;  Surgeon: Sebastian Ache, MD;  Location: [facility];  Service: Urology;  Laterality: Bilateral;    There were no vitals filed for this visit.   Subjective Assessment - 12/22/20 0853    Subjective the manual work helped for several day. I took th Oxybutin when I had twitching and it helped.    Patient Stated Goals decrease pain with urination,    Currently in Pain? Yes    Pain Score 3     Pain Location Pelvis    Pain Orientation Mid    Pain Descriptors / Indicators Dull    Pain Type Chronic pain    Pain Onset More than a month ago    Pain Frequency Intermittent    Aggravating Factors  when has to go to the bathroom, going past the urgency    Pain Relieving Factors sitting on seat for bike riding, sit on a ball at work    Multiple Pain Sites No                          Pelvic Floor Special Questions - 12/22/20 0001    Pelvic Floor Internal  Exam Patient confirms identification and approves pelvic floor assessment and treatment    Exam Type Rectal             [name] Treatment/Exercise - 12/22/20 0001      Lumbar Exercises: Stretches   Other Lumbar Stretch Exercise back and forth from quadruped to childs pose    Other Lumbar Stretch Exercise deep hip rotator stretch bilaterlly      Lumbar Exercises: Quadruped   [place] 10 reps      Manual Therapy   Manual Therapy Internal Pelvic Floor    Internal Pelvic Floor left sidely manual mobilization to bil.levator ani, urethra spincter, puborectalis insertion, fascial release to the sides of the bladder and urethra with release felt und the therapist finger and patient felt the releases; place patient on the right side and worked on the right pelvic floor                    PT Short Term Goals - 10/13/20 0841      PT SHORT TERM GOAL #1   Title independent with initial HEP    Time 4  Period Weeks    Status Achieved      PT SHORT TERM GOAL #2   Title pain reduced by 25% with urination due to the ability to relax the pelvci floor    Time 4    Period Weeks    Status Achieved      PT SHORT TERM GOAL #3   Title Reduced voiding to 1x/hour on a bad day for improved function at work    Time 4    Period Weeks    Status Achieved             PT Long Term Goals - 11/30/20 1534      PT LONG TERM GOAL #1   Title right hip external rotation passively improved >/= 55 degrees to stretch the pelvic floor more effectively    Time 12    Status On-going      PT LONG TERM GOAL #2   Title Pt able to effectively use pelvic floor contractions and relaxation techniques to supress the urge to void    Baseline had some success    Time 12    Period Weeks    Status On-going      PT LONG TERM GOAL #3   Title improve pelvic floor soft tissue relaxation for reduction in pain by 75% with urination    Baseline 40% better    Time 12    Period Weeks     Status On-going      PT LONG TERM GOAL #4   Title pt able to void 1x every 2 hours at work for improved function and decreased distractions during his day    Baseline void from 120 min to 15 min    Time 12    Period Weeks    Status On-going      PT LONG TERM GOAL #5   Title Pt able to manage symptoms with advanced HEP    Time 12    Period Weeks    Status On-going                 Plan - 12/22/20 0929    Clinical Impression Statement Patient has had less irritation with the manual work to the pelvic floor. Patient has increased tone on the right pelvic floor compared to the left. Physical therapist felt increased fascial releases with internal work around the urethra and coccyx. Patient had increased mobility of the coccyx after manual work. Patient pain continues to stay around a 4/10. Patient will benefit from skilled therapy to reduce the urge to urinate while reducing pain.    Personal Factors and Comorbidities Comorbidity 1;Past/Current Experience    Comorbidities left hernia repair, had therapy in the past    Examination-Activity Limitations Toileting    Examination-Participation Restrictions Community Activity    Stability/Clinical Decision Making Stable/Uncomplicated    Rehab Potential Good    PT Duration 12 weeks    PT Treatment/Interventions ADLs/Self Care Home Management;Biofeedback;Cryotherapy;Electrical Stimulation;Moist Heat;Neuromuscular re-education;Therapeutic exercise;Therapeutic activities;Patient/family education;Manual techniques;Dry needling    PT Next Visit Plan manual work to the right pelvic floor    PT Home Exercise Plan Access Code: [email or web address]    Consulted and Agree with Plan of Care Patient           Patient will benefit from skilled therapeutic intervention in order to improve the following deficits and impairments:  Decreased coordination,Increased fascial restricitons,Pain,Decreased activity tolerance,Decreased strength,Decreased mobility  Visit  Diagnosis: Cramp and spasm  Muscle weakness (generalized)  Perineal pain in  male, chronic     Problem List There are no problems to display for this patient.   Patrick Barr, PT 12/22/20 12:02 PM   [facility] [address] [place], [place], [address] Phone: [phone]   Fax:  [phone]  Name: Patrick A Bosler MRN: [id number] Date of Birth: 12-Oct-1966

## 2020-12-29 ENCOUNTER — Ambulatory Visit: Payer: Managed Care, Other (non HMO) | Attending: Urology | Admitting: Physical Therapy

## 2020-12-29 ENCOUNTER — Other Ambulatory Visit: Payer: Self-pay

## 2020-12-29 ENCOUNTER — Encounter: Payer: Self-pay | Admitting: Physical Therapy

## 2020-12-29 DIAGNOSIS — R102 Pelvic and perineal pain: Secondary | ICD-10-CM | POA: Diagnosis present

## 2020-12-29 DIAGNOSIS — R252 Cramp and spasm: Secondary | ICD-10-CM | POA: Insufficient documentation

## 2020-12-29 DIAGNOSIS — M6281 Muscle weakness (generalized): Secondary | ICD-10-CM | POA: Insufficient documentation

## 2020-12-29 DIAGNOSIS — G8929 Other chronic pain: Secondary | ICD-10-CM | POA: Diagnosis present

## 2020-12-29 NOTE — Therapy (Addendum)
[facility] [address] [place], [place], [address] Phone: [phone]   Fax:  [phone]  Physical Therapy Treatment  Patient Details  Name: Patrick Barr MRN: [id number] Date of Birth: 1966-07-13 Referring Provider (PT): Dr. Franchot Gallo   Encounter Date: 12/29/2020   PT End of Session - 12/29/20 0856    Visit Number 13    Date for PT Re-Evaluation 02/05/21    Authorization Type [employer]    PT Start Time 0800    PT Stop Time 0845    PT Time Calculation (min) 45 min    Activity Tolerance Patient tolerated treatment well;No increased pain    Behavior During Therapy WFL for tasks assessed/performed           Past Medical History:  Diagnosis Date  . Arthritis   . Frequency of urination   . History of kidney stones   . Urgency of urination     Past Surgical History:  Procedure Laterality Date  . EXTRACORPOREAL SHOCK WAVE LITHOTRIPSY  2006  . HERNIA REPAIR Left 2018  . VASECTOMY Bilateral 05/15/2016   Procedure: VASECTOMY;  Surgeon: Alexis Frock, MD;  Location: [facility];  Service: Urology;  Laterality: Bilateral;    There were no vitals filed for this visit.   Subjective Assessment - 12/29/20 0802    Subjective The pain level is good. Hard to wait 30 sec to 1 minute.    Patient Stated Goals decrease pain with urination,    Currently in Pain? Yes    Pain Score 4     Pain Location Pelvis    Pain Orientation Mid    Pain Descriptors / Indicators Dull    Pain Type Chronic pain    Pain Onset More than a month ago    Pain Frequency Intermittent    Aggravating Factors  when has to go to the bathroom, going past the urgency    Pain Relieving Factors sititng on the seat for bike riding, sit on a ball at work    Multiple Pain Sites No                          Pelvic Floor Special Questions - 12/29/20 0001    Pelvic Floor Internal Exam Patient confirms identification  and approves pelvic floor assessment and treatment    Exam Type Rectal             [name] Treatment/Exercise - 12/29/20 0001      Therapeutic Activites    Therapeutic Activities Other Therapeutic Activities    Other Therapeutic Activities discussed with patient on hip movements to do prior to urinating to relax the pelvic floor to help initiate the stream of urine and going to the furthest bathroom to delay urination      Manual Therapy   Manual Therapy Internal Pelvic Floor    Internal Pelvic Floor right sidely manual mobilization to superficial transverse, puborectalis, ischiococcygeus, pubococcygeus, coccygeus, and sides of the urethra with releases felt and elongation of tissue                  PT Education - 12/29/20 0853    Education Details educated ways to move his hips to relax the pelvic floor to initiate urinary stream, educated on walking further to the bathroom    Person(s) Educated Patient    Methods Explanation;Demonstration    Comprehension Returned demonstration;Verbalized understanding  PT Short Term Goals - 10/13/20 0841      PT SHORT TERM GOAL #1   Title independent with initial HEP    Time 4    Period Weeks    Status Achieved      PT SHORT TERM GOAL #2   Title pain reduced by 25% with urination due to the ability to relax the pelvci floor    Time 4    Period Weeks    Status Achieved      PT SHORT TERM GOAL #3   Title Reduced voiding to 1x/hour on a bad day for improved function at work    Time 4    Period Weeks    Status Achieved             PT Long Term Goals - 11/30/20 1534      PT LONG TERM GOAL #1   Title right hip external rotation passively improved >/= 55 degrees to stretch the pelvic floor more effectively    Time 12    Status On-going      PT LONG TERM GOAL #2   Title Pt able to effectively use pelvic floor contractions and relaxation techniques to supress the urge to void    Baseline had some  success    Time 12    Period Weeks    Status On-going      PT LONG TERM GOAL #3   Title improve pelvic floor soft tissue relaxation for reduction in pain by 75% with urination    Baseline 40% better    Time 12    Period Weeks    Status On-going      PT LONG TERM GOAL #4   Title pt able to void 1x every 2 hours at work for improved function and decreased distractions during his day    Baseline void from 120 min to 15 min    Time 12    Period Weeks    Status On-going      PT LONG TERM GOAL #5   Title Pt able to manage symptoms with advanced HEP    Time 12    Period Weeks    Status On-going                 Plan - 12/29/20 1497    Clinical Impression Statement Patient reports his pain subsides faster and not as sharp. He has better control of muscle spasms. Patient had lots of good fascial releases of the pelvic floor with the manual work. Patient was able to move his pelvis and hips better after the manual work. Patient is going to try to walk to the furthest bathroom to see if he is able to control the muscle spasms. Patient is progressing well with the internal manual work. Patient will benefit from skilled therapy to reduce the urge to urinate while reducing pain.    Personal Factors and Comorbidities Comorbidity 1;Past/Current Experience    Comorbidities left hernia repair, had therapy in the past    Examination-Activity Limitations Toileting    Examination-Participation Restrictions Community Activity    Stability/Clinical Decision Making Stable/Uncomplicated    Rehab Potential Good    PT Frequency 1x / week    PT Duration 12 weeks    PT Treatment/Interventions ADLs/Self Care Home Management;Biofeedback;Cryotherapy;Electrical Stimulation;Moist Heat;Neuromuscular re-education;Therapeutic exercise;Therapeutic activities;Patient/family education;Manual techniques;Dry needling    PT Next Visit Plan manual work to the pelvic floor, assess hip mobiity, see how the urge and  initiating the urine stream  PT Home Exercise Plan Access Code: [email or web address]    Consulted and Agree with Plan of Care Patient           Patient will benefit from skilled therapeutic intervention in order to improve the following deficits and impairments:  Decreased coordination,Increased fascial restricitons,Pain,Decreased activity tolerance,Decreased strength,Decreased mobility  Visit Diagnosis: Cramp and spasm  Muscle weakness (generalized)  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   Earlie Counts, PT 12/29/20 9:04 AM    [facility] [address] [place], [place], [address] Phone: [phone]   Fax:  [phone]  Name: Patrick Barr MRN: [id number] Date of Birth: [birth year]  PHYSICAL THERAPY DISCHARGE SUMMARY  Visits from Start of Care: 13  Current functional level related to goals / functional outcomes: See above. Patient called today to be discharged with no reason.    Remaining deficits: See above.   Education / Equipment: HEP Plan: Patient agrees to discharge.  Patient goals were not met. Patient is being discharged due to the patient's request.  fThank you for the referral. Earlie Counts, PT 01/16/21 5:23 PM  ?????

## 2021-01-05 ENCOUNTER — Ambulatory Visit: Payer: Managed Care, Other (non HMO) | Admitting: Physical Therapy

## 2021-01-12 ENCOUNTER — Encounter: Payer: Managed Care, Other (non HMO) | Admitting: Physical Therapy

## 2021-01-19 ENCOUNTER — Encounter: Payer: Managed Care, Other (non HMO) | Admitting: Physical Therapy

## 2021-01-26 ENCOUNTER — Encounter: Payer: Managed Care, Other (non HMO) | Admitting: Physical Therapy

## 2022-01-09 DIAGNOSIS — R1032 Left lower quadrant pain: Secondary | ICD-10-CM | POA: Diagnosis not present

## 2022-03-16 DIAGNOSIS — S70362A Insect bite (nonvenomous), left thigh, initial encounter: Secondary | ICD-10-CM | POA: Diagnosis not present

## 2022-03-19 DIAGNOSIS — L03116 Cellulitis of left lower limb: Secondary | ICD-10-CM | POA: Diagnosis not present

## 2022-03-29 DIAGNOSIS — L03116 Cellulitis of left lower limb: Secondary | ICD-10-CM | POA: Diagnosis not present

## 2022-04-08 DIAGNOSIS — L821 Other seborrheic keratosis: Secondary | ICD-10-CM | POA: Diagnosis not present

## 2022-04-08 DIAGNOSIS — D225 Melanocytic nevi of trunk: Secondary | ICD-10-CM | POA: Diagnosis not present

## 2022-06-20 DIAGNOSIS — L309 Dermatitis, unspecified: Secondary | ICD-10-CM | POA: Diagnosis not present

## 2022-06-20 DIAGNOSIS — L989 Disorder of the skin and subcutaneous tissue, unspecified: Secondary | ICD-10-CM | POA: Diagnosis not present

## 2022-08-13 DIAGNOSIS — W5501XA Bitten by cat, initial encounter: Secondary | ICD-10-CM | POA: Diagnosis not present

## 2022-08-13 DIAGNOSIS — S91052A Open bite, left ankle, initial encounter: Secondary | ICD-10-CM | POA: Diagnosis not present

## 2022-08-13 DIAGNOSIS — Z23 Encounter for immunization: Secondary | ICD-10-CM | POA: Diagnosis not present

## 2022-09-24 DIAGNOSIS — Z0000 Encounter for general adult medical examination without abnormal findings: Secondary | ICD-10-CM | POA: Diagnosis not present

## 2022-09-24 DIAGNOSIS — N529 Male erectile dysfunction, unspecified: Secondary | ICD-10-CM | POA: Diagnosis not present

## 2022-09-24 DIAGNOSIS — Z13220 Encounter for screening for lipoid disorders: Secondary | ICD-10-CM | POA: Diagnosis not present

## 2022-09-24 DIAGNOSIS — Z131 Encounter for screening for diabetes mellitus: Secondary | ICD-10-CM | POA: Diagnosis not present

## 2022-11-04 DIAGNOSIS — R1903 Right lower quadrant abdominal swelling, mass and lump: Secondary | ICD-10-CM | POA: Diagnosis not present

## 2022-11-11 DIAGNOSIS — K5730 Diverticulosis of large intestine without perforation or abscess without bleeding: Secondary | ICD-10-CM | POA: Diagnosis not present

## 2022-11-11 DIAGNOSIS — N200 Calculus of kidney: Secondary | ICD-10-CM | POA: Diagnosis not present

## 2022-11-20 DIAGNOSIS — N200 Calculus of kidney: Secondary | ICD-10-CM | POA: Diagnosis not present

## 2022-11-20 DIAGNOSIS — K4020 Bilateral inguinal hernia, without obstruction or gangrene, not specified as recurrent: Secondary | ICD-10-CM | POA: Diagnosis not present

## 2022-11-27 DIAGNOSIS — N200 Calculus of kidney: Secondary | ICD-10-CM | POA: Diagnosis not present

## 2023-01-06 DIAGNOSIS — S46001A Unspecified injury of muscle(s) and tendon(s) of the rotator cuff of right shoulder, initial encounter: Secondary | ICD-10-CM | POA: Diagnosis not present

## 2023-01-17 DIAGNOSIS — M25511 Pain in right shoulder: Secondary | ICD-10-CM | POA: Diagnosis not present

## 2023-03-12 ENCOUNTER — Encounter: Payer: Self-pay | Admitting: Sports Medicine

## 2023-03-12 ENCOUNTER — Other Ambulatory Visit: Payer: Self-pay | Admitting: Sports Medicine

## 2023-03-12 DIAGNOSIS — M25511 Pain in right shoulder: Secondary | ICD-10-CM

## 2023-04-09 ENCOUNTER — Encounter: Payer: Self-pay | Admitting: Sports Medicine

## 2023-04-22 ENCOUNTER — Ambulatory Visit
Admission: RE | Admit: 2023-04-22 | Discharge: 2023-04-22 | Disposition: A | Discharge status: Home / Self Care | Payer: BC Managed Care – PPO | Source: Ambulatory Visit | Attending: Sports Medicine | Admitting: Sports Medicine

## 2023-04-22 DIAGNOSIS — M25511 Pain in right shoulder: Secondary | ICD-10-CM

## 2023-05-29 DIAGNOSIS — M25511 Pain in right shoulder: Secondary | ICD-10-CM | POA: Diagnosis not present

## 2023-06-09 ENCOUNTER — Encounter (HOSPITAL_COMMUNITY): Payer: Self-pay | Admitting: Urology

## 2023-06-09 ENCOUNTER — Emergency Department (HOSPITAL_BASED_OUTPATIENT_CLINIC_OR_DEPARTMENT_OTHER)
Admission: EM | Admit: 2023-06-09 | Discharge: 2023-06-09 | Disposition: A | Discharge status: Home / Self Care | Payer: BC Managed Care – PPO | Source: Home / Self Care | Attending: Emergency Medicine | Admitting: Emergency Medicine

## 2023-06-09 ENCOUNTER — Ambulatory Visit (HOSPITAL_COMMUNITY): Payer: BC Managed Care – PPO

## 2023-06-09 ENCOUNTER — Ambulatory Visit (HOSPITAL_COMMUNITY): Payer: BC Managed Care – PPO | Admitting: Anesthesiology

## 2023-06-09 ENCOUNTER — Other Ambulatory Visit: Payer: Self-pay

## 2023-06-09 ENCOUNTER — Other Ambulatory Visit: Payer: Self-pay | Admitting: Urology

## 2023-06-09 ENCOUNTER — Encounter (HOSPITAL_COMMUNITY)
Admission: RE | Disposition: A | Discharge status: Home / Self Care | Payer: Self-pay | Source: Ambulatory Visit | Attending: Urology

## 2023-06-09 ENCOUNTER — Ambulatory Visit (HOSPITAL_COMMUNITY)
Admission: RE | Admit: 2023-06-09 | Discharge: 2023-06-09 | Disposition: A | Discharge status: Home / Self Care | Payer: BC Managed Care – PPO | Source: Ambulatory Visit | Attending: Urology | Admitting: Urology

## 2023-06-09 ENCOUNTER — Encounter (HOSPITAL_BASED_OUTPATIENT_CLINIC_OR_DEPARTMENT_OTHER): Payer: Self-pay

## 2023-06-09 ENCOUNTER — Emergency Department (HOSPITAL_BASED_OUTPATIENT_CLINIC_OR_DEPARTMENT_OTHER): Payer: BC Managed Care – PPO

## 2023-06-09 DIAGNOSIS — N201 Calculus of ureter: Secondary | ICD-10-CM

## 2023-06-09 DIAGNOSIS — N3001 Acute cystitis with hematuria: Secondary | ICD-10-CM | POA: Insufficient documentation

## 2023-06-09 DIAGNOSIS — R001 Bradycardia, unspecified: Secondary | ICD-10-CM | POA: Diagnosis not present

## 2023-06-09 DIAGNOSIS — N200 Calculus of kidney: Secondary | ICD-10-CM

## 2023-06-09 DIAGNOSIS — Z87891 Personal history of nicotine dependence: Secondary | ICD-10-CM | POA: Diagnosis not present

## 2023-06-09 DIAGNOSIS — I10 Essential (primary) hypertension: Secondary | ICD-10-CM | POA: Insufficient documentation

## 2023-06-09 DIAGNOSIS — N134 Hydroureter: Secondary | ICD-10-CM | POA: Diagnosis not present

## 2023-06-09 DIAGNOSIS — N132 Hydronephrosis with renal and ureteral calculous obstruction: Secondary | ICD-10-CM | POA: Insufficient documentation

## 2023-06-09 DIAGNOSIS — K5730 Diverticulosis of large intestine without perforation or abscess without bleeding: Secondary | ICD-10-CM | POA: Diagnosis not present

## 2023-06-09 DIAGNOSIS — R1031 Right lower quadrant pain: Secondary | ICD-10-CM | POA: Diagnosis not present

## 2023-06-09 HISTORY — PX: CYSTOSCOPY W/ URETERAL STENT PLACEMENT: SHX1429

## 2023-06-09 LAB — COMPREHENSIVE METABOLIC PANEL
ALT: 27 U/L (ref 0–44)
AST: 24 U/L (ref 15–41)
Albumin: 4.9 g/dL (ref 3.5–5.0)
Alkaline Phosphatase: 73 U/L (ref 38–126)
Anion gap: 10 (ref 5–15)
BUN: 17 mg/dL (ref 6–20)
CO2: 27 mmol/L (ref 22–32)
Calcium: 9.8 mg/dL (ref 8.9–10.3)
Chloride: 104 mmol/L (ref 98–111)
Creatinine, Ser: 0.94 mg/dL (ref 0.61–1.24)
GFR, Estimated: >60 mL/min (ref >60)
Glucose, Bld: 168 mg/dL — ABNORMAL HIGH (ref 70–99)
Potassium: 3.7 mmol/L (ref 3.5–5.1)
Sodium: 141 mmol/L (ref 135–145)
Total Bilirubin: 0.4 mg/dL (ref 0.3–1.2)
Total Protein: 6.8 g/dL (ref 6.5–8.1)

## 2023-06-09 LAB — CBC
HCT: 46.2 % (ref 39.0–52.0)
Hemoglobin: 16.1 g/dL (ref 13.0–17.0)
MCH: 31.4 pg (ref 26.0–34.0)
MCHC: 34.8 g/dL (ref 30.0–36.0)
MCV: 90.2 fL (ref 80.0–100.0)
Platelets: 263 10*3/uL (ref 150–400)
RBC: 5.12 MIL/uL (ref 4.22–5.81)
RDW: 12.4 % (ref 11.5–15.5)
WBC: 8.7 10*3/uL (ref 4.0–10.5)
nRBC: 0 % (ref 0.0–0.2)

## 2023-06-09 LAB — URINALYSIS, ROUTINE W REFLEX MICROSCOPIC
Bilirubin Urine: NEGATIVE
Glucose, UA: NEGATIVE mg/dL
Ketones, ur: 15 mg/dL — AB
Leukocytes,Ua: NEGATIVE
Nitrite: NEGATIVE
RBC / HPF: >50 RBC/hpf (ref 0–5)
Specific Gravity, Urine: >1.046 — ABNORMAL HIGH (ref 1.005–1.030)
pH: 6.5 (ref 5.0–8.0)

## 2023-06-09 LAB — LIPASE, BLOOD: Lipase: 35 U/L (ref 11–51)

## 2023-06-09 SURGERY — CYSTOSCOPY, WITH RETROGRADE PYELOGRAM AND URETERAL STENT INSERTION
Anesthesia: General | Laterality: Right

## 2023-06-09 MED ORDER — FENTANYL CITRATE PF 50 MCG/ML IJ SOSY: 25.0000 ug | PREFILLED_SYRINGE | INTRAMUSCULAR | Status: DC | PRN

## 2023-06-09 MED ORDER — CHLORHEXIDINE GLUCONATE 0.12 % MT SOLN
15.0000 mL | Freq: Once | OROMUCOSAL | Status: AC
  Administered 2023-06-09: 15 mL via OROMUCOSAL

## 2023-06-09 MED ORDER — TRAMADOL HCL 50 MG PO TABS: 50.0000 mg | ORAL_TABLET | Freq: Four times a day (QID) | ORAL | 0 refills | Status: AC | PRN

## 2023-06-09 MED ORDER — CEPHALEXIN 500 MG PO CAPS: 500.0000 mg | ORAL_CAPSULE | Freq: Two times a day (BID) | ORAL | 0 refills | Status: AC

## 2023-06-09 MED ORDER — MIDAZOLAM HCL 2 MG/2ML IJ SOLN
INTRAMUSCULAR | Status: AC
  Filled 2023-06-09: qty 2

## 2023-06-09 MED ORDER — DEXAMETHASONE SODIUM PHOSPHATE 4 MG/ML IJ SOLN
INTRAMUSCULAR | Status: DC | PRN
  Administered 2023-06-09: 8 mg via INTRAVENOUS

## 2023-06-09 MED ORDER — IOHEXOL 300 MG/ML  SOLN
INTRAMUSCULAR | Status: DC | PRN
  Administered 2023-06-09: 6 mL

## 2023-06-09 MED ORDER — ONDANSETRON HCL 4 MG/2ML IJ SOLN
4.0000 mg | Freq: Once | INTRAMUSCULAR | Status: AC | PRN
  Administered 2023-06-09: 4 mg via INTRAVENOUS
  Filled 2023-06-09: qty 2

## 2023-06-09 MED ORDER — LACTATED RINGERS IV SOLN: INTRAVENOUS | Status: DC

## 2023-06-09 MED ORDER — ACETAMINOPHEN 10 MG/ML IV SOLN: 1000.0000 mg | Freq: Once | INTRAVENOUS | Status: DC | PRN

## 2023-06-09 MED ORDER — FENTANYL CITRATE (PF) 100 MCG/2ML IJ SOLN
INTRAMUSCULAR | Status: DC | PRN
  Administered 2023-06-09 (×4): 50 ug via INTRAVENOUS

## 2023-06-09 MED ORDER — ONDANSETRON 4 MG PO TBDP
4.0000 mg | ORAL_TABLET | Freq: Three times a day (TID) | ORAL | Status: AC | PRN
Start: 2023-06-09 — End: 2023-06-12

## 2023-06-09 MED ORDER — ONDANSETRON HCL 4 MG/2ML IJ SOLN
INTRAMUSCULAR | Status: AC
  Filled 2023-06-09: qty 2

## 2023-06-09 MED ORDER — MEPERIDINE HCL 50 MG/ML IJ SOLN: 6.2500 mg | INTRAMUSCULAR | Status: DC | PRN

## 2023-06-09 MED ORDER — ONDANSETRON HCL 4 MG/2ML IJ SOLN
INTRAMUSCULAR | Status: DC | PRN
  Administered 2023-06-09: 4 mg via INTRAVENOUS

## 2023-06-09 MED ORDER — CIPROFLOXACIN IN D5W 400 MG/200ML IV SOLN
400.0000 mg | INTRAVENOUS | Status: AC
  Administered 2023-06-09: 400 mg via INTRAVENOUS
  Filled 2023-06-09: qty 200

## 2023-06-09 MED ORDER — FENTANYL CITRATE (PF) 100 MCG/2ML IJ SOLN
INTRAMUSCULAR | Status: AC
  Filled 2023-06-09: qty 2

## 2023-06-09 MED ORDER — TAMSULOSIN HCL 0.4 MG PO CAPS: 0.4000 mg | ORAL_CAPSULE | Freq: Every day | ORAL | 0 refills | Status: DC

## 2023-06-09 MED ORDER — LIDOCAINE HCL (CARDIAC) PF 100 MG/5ML IV SOSY
PREFILLED_SYRINGE | INTRAVENOUS | Status: DC | PRN
  Administered 2023-06-09: 100 mg via INTRAVENOUS

## 2023-06-09 MED ORDER — SODIUM CHLORIDE 0.9 % IR SOLN
Status: DC | PRN
  Administered 2023-06-09: 3000 mL via INTRAVESICAL

## 2023-06-09 MED ORDER — MIDAZOLAM HCL 5 MG/5ML IJ SOLN
INTRAMUSCULAR | Status: DC | PRN
  Administered 2023-06-09: 2 mg via INTRAVENOUS

## 2023-06-09 MED ORDER — ONDANSETRON HCL 4 MG/2ML IJ SOLN: 4.0000 mg | Freq: Once | INTRAMUSCULAR | Status: DC | PRN

## 2023-06-09 MED ORDER — NAPROXEN 500 MG PO TABS: 500.0000 mg | ORAL_TABLET | Freq: Two times a day (BID) | ORAL | 0 refills | Status: AC

## 2023-06-09 MED ORDER — PROPOFOL 10 MG/ML IV BOLUS
INTRAVENOUS | Status: DC | PRN
Start: 2023-06-09 — End: 2023-06-09
  Administered 2023-06-09: 140 mg via INTRAVENOUS

## 2023-06-09 MED ORDER — LACTATED RINGERS IV BOLUS
1000.0000 mL | Freq: Once | INTRAVENOUS | Status: AC
  Administered 2023-06-09: 1000 mL via INTRAVENOUS

## 2023-06-09 MED ORDER — KETOROLAC TROMETHAMINE 15 MG/ML IJ SOLN
15.0000 mg | Freq: Once | INTRAMUSCULAR | Status: AC
  Administered 2023-06-09: 15 mg via INTRAVENOUS
  Filled 2023-06-09: qty 1

## 2023-06-09 MED ORDER — LIDOCAINE HCL (PF) 2 % IJ SOLN
INTRAMUSCULAR | Status: AC
  Filled 2023-06-09: qty 5

## 2023-06-09 MED ORDER — TAMSULOSIN HCL 0.4 MG PO CAPS: 0.4000 mg | ORAL_CAPSULE | Freq: Every day | ORAL | 1 refills | Status: AC

## 2023-06-09 MED ORDER — IOHEXOL 300 MG/ML  SOLN
100.0000 mL | Freq: Once | INTRAMUSCULAR | Status: AC | PRN
  Administered 2023-06-09: 100 mL via INTRAVENOUS

## 2023-06-09 MED ORDER — MEPERIDINE HCL 50 MG/ML IJ SOLN
INTRAMUSCULAR | Status: AC
  Administered 2023-06-09: 12.5 mg via INTRAVENOUS
  Filled 2023-06-09: qty 1

## 2023-06-09 MED ORDER — DEXAMETHASONE SODIUM PHOSPHATE 10 MG/ML IJ SOLN
INTRAMUSCULAR | Status: AC
  Filled 2023-06-09: qty 1

## 2023-06-09 MED ORDER — HYOSCYAMINE SULFATE 0.125 MG SL SUBL: 0.1250 mg | SUBLINGUAL_TABLET | SUBLINGUAL | 0 refills | Status: AC | PRN

## 2023-06-09 MED ORDER — OXYCODONE HCL 5 MG/5ML PO SOLN: 5.0000 mg | Freq: Once | ORAL | Status: DC | PRN

## 2023-06-09 MED ORDER — OXYCODONE HCL 5 MG PO TABS: 5.0000 mg | ORAL_TABLET | Freq: Once | ORAL | Status: DC | PRN

## 2023-06-09 MED ORDER — PROPOFOL 10 MG/ML IV BOLUS
INTRAVENOUS | Status: AC
  Filled 2023-06-09: qty 20

## 2023-06-09 MED ORDER — FENTANYL CITRATE PF 50 MCG/ML IJ SOSY
50.0000 ug | PREFILLED_SYRINGE | Freq: Once | INTRAMUSCULAR | Status: AC
  Administered 2023-06-09: 50 ug via INTRAVENOUS
  Filled 2023-06-09: qty 1

## 2023-06-09 SURGICAL SUPPLY — 12 items
BAG URO CATCHER STRL LF (MISCELLANEOUS) ×1 IMPLANT
CATH URETL OPEN 5X70 (CATHETERS) ×1 IMPLANT
CLOTH BEACON ORANGE TIMEOUT ST (SAFETY) ×1 IMPLANT
GLOVE BIO SURGEON STRL SZ7 (GLOVE) ×1 IMPLANT
GOWN STRL REUS W/ TWL XL LVL3 (GOWN DISPOSABLE) ×1 IMPLANT
GOWN STRL REUS W/TWL XL LVL3 (GOWN DISPOSABLE) ×1
GUIDEWIRE ZIPWRE .038 STRAIGHT (WIRE) ×1 IMPLANT
KIT TURNOVER KIT A (KITS) IMPLANT
MANIFOLD NEPTUNE II (INSTRUMENTS) ×1 IMPLANT
PACK CYSTO (CUSTOM PROCEDURE TRAY) ×1 IMPLANT
STENT URET 6FRX26 CONTOUR (STENTS) IMPLANT
TUBING CONNECTING 10 (TUBING) ×1 IMPLANT

## 2023-06-09 NOTE — Anesthesia Preprocedure Evaluation (Signed)
Anesthesia Evaluation  Patient identified by MRN, date of birth, ID band Patient awake    Reviewed: Allergy & Precautions, NPO status , Patient's Chart, lab work & pertinent test results, reviewed documented beta blocker date and time   History of Anesthesia Complications Negative for: history of anesthetic complications  Airway Mallampati: II  TM Distance: >3 FB     Dental no notable dental hx.    Pulmonary neg shortness of breath, neg pneumonia , neg COPD, neg recent URI, former smoker   breath sounds clear to auscultation       Cardiovascular (-) hypertension(-) angina (-) CAD and (-) Past MI  Rhythm:Regular Rate:Normal  Baseline low HR per patient   Neuro/Psych neg Seizures    GI/Hepatic ,neg GERD  ,,(+) neg Cirrhosis        Endo/Other  neg diabetes    Renal/GU Renal disease     Musculoskeletal  (+) Arthritis ,    Abdominal   Peds  Hematology   Anesthesia Other Findings   Reproductive/Obstetrics                              Anesthesia Physical Anesthesia Plan  ASA: 2  Anesthesia Plan: General   Post-op Pain Management:    Induction: Intravenous  PONV Risk Score and Plan: Ondansetron and Dexamethasone  Airway Management Planned: LMA  Additional Equipment:   Intra-op Plan:   Post-operative Plan:   Informed Consent:      Dental advisory given  Plan Discussed with: CRNA  Anesthesia Plan Comments:          Anesthesia Quick Evaluation

## 2023-06-09 NOTE — Op Note (Signed)
Operative Note  Preoperative diagnosis:  1.  Right ureteral stone  Postoperative diagnosis: 1.  Same 2. hydronephrosis  Procedure(s): 1.  Cystoscopy 2. Right  retrograde pyelogram with interpretation 3. Right  ureteral stent placement 6x26 cm 4. Fluoroscopy <1 hour with intraoperative interpretation  Surgeon: Irine Seal, MD  Assistants:  None  Anesthesia:  General  Complications:  None  EBL:  minimal  Specimens: 1. none  Drains/Catheters: 1.  Right 6Fr x 26cm ureteral stent  Intraoperative findings:   Cystoscopy demonstrated no suspicious lesions, masses, stones or other pathology. Right Retrograde pyelogram demonstrated hydronephrosis. Successful right  ureteral stent placement with curl in the renal pelvis and bladder respectively.  Indication:  Patrick Barr is a 57 y.o. male with a symptomatic right ureteral stone. After reviewing the management options for treatment, he elected to proceed with the above surgical procedure(s). We have discussed the potential benefits and risks of the procedure, side effects of the proposed treatment, the likelihood of the patient achieving the goals of the procedure, and any potential problems that might occur during the procedure or recuperation. Informed consent has been obtained.  Description of procedure: The patient was taken to the operating room and general anesthesia was induced.  The patient was placed in the dorsal lithotomy position, prepped and draped in the usual sterile fashion, and preoperative antibiotics were administered. A preoperative time-out was performed.   Cystourethroscopy was performed.  The patient's urethra was examined and was normal. There was mild bilobar prostatic hypertrophy. The bladder was then systematically examined in its entirety. There was no evidence for any bladder tumors, stones, or other mucosal pathology.    Attention then turned to the right ureteral orifice. A 0.038 zip wire was passed  through the right orifice and over the wire a 5 Fr open ended catheter was inserted and passed up to the level of the renal pelvis. Omnipaque contrast was injected through the ureteral catheter and a retrograde pyelogram was performed with findings as dictated above. The wire was then replaced and the open ended catheter was removed.   A 6Fr x 26cm ureteral stent was advance over the wire. The stent was positioned appropriately under fluoroscopic and cystoscopic guidance.  The wire was then removed with an adequate stent curl noted in the renal pelvis as well as in the bladder.  The bladder was then emptied and the procedure ended.  The patient appeared to tolerate the procedure well and without complications.  The patient was able to be awakened and transferred to the recovery unit in satisfactory condition.   Plan:   --d/c home --will plan for staged ureteroscopy with laser litho  Neva Seat MD [facility]  Pager: [phone]

## 2023-06-09 NOTE — Anesthesia Procedure Notes (Signed)
Procedure Name: LMA Insertion Date/Time: 06/09/2023 5:15 PM  Performed by: Maurene Capes, CRNAPre-anesthesia Checklist: Patient identified, Emergency Drugs available, Suction available and Patient being monitored Patient Re-evaluated:Patient Re-evaluated prior to induction Oxygen Delivery Method: Circle System Utilized Preoxygenation: Pre-oxygenation with 100% oxygen Induction Type: IV induction Ventilation: Mask ventilation without difficulty LMA: LMA inserted LMA Size: 4.0 Number of attempts: 1 Airway Equipment and Method: Bite block Placement Confirmation: positive ETCO2 Tube secured with: Tape Dental Injury: Teeth and Oropharynx as per pre-operative assessment

## 2023-06-09 NOTE — Discharge Instructions (Signed)

## 2023-06-09 NOTE — Anesthesia Postprocedure Evaluation (Signed)
Anesthesia Post Note  Patient: Patrick Barr  Procedure(s) Performed: CYSTOSCOPY WITH RIGHT RETROGRADE PYELOGRAM/RIGHT URETERAL STENT PLACEMENT (Right)     Patient location during evaluation: PACU Anesthesia Type: General Level of consciousness: awake and alert Pain management: pain level controlled Vital Signs Assessment: post-procedure vital signs reviewed and stable Respiratory status: spontaneous breathing, nonlabored ventilation, respiratory function stable and patient connected to nasal cannula oxygen Cardiovascular status: blood pressure returned to baseline and stable Postop Assessment: no apparent nausea or vomiting Anesthetic complications: no   No notable events documented.  Last Vitals:  Vitals:   06/09/23 1800 06/09/23 1815  BP: 138/74 125/73  Pulse: 64 (!) 47  Resp: 14 12  Temp:    SpO2: 96% 95%    Last Pain:  Vitals:   06/09/23 1815  TempSrc:   PainSc: 0-No pain                 Mariann Barter

## 2023-06-09 NOTE — ED Triage Notes (Signed)
Patient arrives to ED POV C/O Lower Abdominal pain x1hr. Pt has been throwing up since and has been Hypertensive.

## 2023-06-09 NOTE — Transfer of Care (Signed)
Immediate Anesthesia Transfer of Care Note  Patient: Patrick Barr  Procedure(s) Performed: CYSTOSCOPY WITH RIGHT RETROGRADE PYELOGRAM/RIGHT URETERAL STENT PLACEMENT (Right)  Patient Location: PACU  Anesthesia Type:General  Level of Consciousness: drowsy and patient cooperative  Airway & Oxygen Therapy: Patient Spontanous Breathing and Patient connected to face mask oxygen  Post-op Assessment: Report given to RN and Post -op Vital signs reviewed and stable  Post vital signs: Reviewed and stable  Last Vitals:  Vitals Value Taken Time  BP 126/72 06/09/23 1745  Temp    Pulse 67 06/09/23 1748  Resp 11 06/09/23 1748  SpO2 100 % 06/09/23 1748  Vitals shown include unfiled device data.  Last Pain:  Vitals:   06/09/23 1608  TempSrc:   PainSc: 3       Patients Stated Pain Goal: 0 (06/09/23 1608)  Complications: No notable events documented.

## 2023-06-09 NOTE — H&P (Signed)
H&P  History of Present Illness: Patrick Barr is a 57 y.o. year old M who presents today for stent placement for a right ureteral stone stone  No acute complaints  Past Medical History:  Diagnosis Date   Arthritis    Frequency of urination    History of kidney stones    Urgency of urination     Past Surgical History:  Procedure Laterality Date   EXTRACORPOREAL SHOCK WAVE LITHOTRIPSY  2006   HERNIA REPAIR Left 2018   VASECTOMY Bilateral 05/15/2016   Procedure: VASECTOMY;  Surgeon: Sebastian Ache, MD;  Location: [facility];  Service: Urology;  Laterality: Bilateral;    Home Medications:  Current Meds  Medication Sig   cephALEXin (KEFLEX) 500 MG capsule Take 1 capsule (500 mg total) by mouth 2 (two) times daily for 5 days.   Multiple Vitamin (MULTIVITAMIN) tablet Take 1 tablet by mouth daily.   naproxen (NAPROSYN) 500 MG tablet Take 1 tablet (500 mg total) by mouth 2 (two) times daily.   tamsulosin (FLOMAX) 0.4 MG CAPS capsule Take 1 capsule (0.4 mg total) by mouth daily for 5 days.    Allergies:  Allergies  Allergen Reactions   Bee Venom Shortness Of Breath, Itching and Swelling   Wasp Venom Anaphylaxis    History reviewed. No pertinent family history.  Social History:  reports that he quit smoking about 27 years ago. His smoking use included cigarettes. He started smoking about 32 years ago. He has never used smokeless tobacco. He reports current alcohol use. He reports current drug use. Drug: Marijuana.  ROS: A complete review of systems was performed.  All systems are negative except for pertinent findings as noted.  Physical Exam:  Vital signs in last 24 hours: Temp:  [98.3 F (36.8 C)-98.4 F (36.9 C)] 98.4 F (36.9 C) (08/12 1600) Pulse Rate:  [39-65] 56 (08/12 1600) Resp:  [7-20] 16 (08/12 1600) BP: (125-186)/(68-95) 125/71 (08/12 1600) SpO2:  [93 %-100 %] 97 % (08/12 1600) Weight:  [90.7 kg] 90.7 kg (08/12 0048) Constitutional:  Alert  and oriented, No acute distress Cardiovascular: Regular rate and rhythm Respiratory: Normal respiratory effort, Lungs clear bilaterally GI: Abdomen is soft, nontender, nondistended, no abdominal masses Lymphatic: No lymphadenopathy Neurologic: Grossly intact, no focal deficits Psychiatric: Normal mood and affect   Laboratory Data:  Recent Labs    06/09/23 0056  WBC 8.7  HGB 16.1  HCT 46.2  PLT 263    Recent Labs    06/09/23 0056  NA 141  K 3.7  CL 104  GLUCOSE 168*  BUN 17  CALCIUM 9.8  CREATININE 0.94     Results for orders placed or performed during the hospital encounter of 06/09/23 (from the past 24 hour(s))  Lipase, blood     Status: None   Collection Time: 06/09/23 12:56 AM  Result Value Ref Range   Lipase 35 11 - 51 U/L  Comprehensive metabolic panel     Status: Abnormal   Collection Time: 06/09/23 12:56 AM  Result Value Ref Range   Sodium 141 135 - 145 mmol/L   Potassium 3.7 3.5 - 5.1 mmol/L   Chloride 104 98 - 111 mmol/L   CO2 27 22 - 32 mmol/L   Glucose, Bld 168 (H) 70 - 99 mg/dL   BUN 17 6 - 20 mg/dL   Creatinine, Ser 9.60 0.61 - 1.24 mg/dL   Calcium 9.8 8.9 - 45.4 mg/dL   Total Protein 6.8 6.5 - 8.1 g/dL  Albumin 4.9 3.5 - 5.0 g/dL   AST 24 15 - 41 U/L   ALT 27 0 - 44 U/L   Alkaline Phosphatase 73 38 - 126 U/L   Total Bilirubin 0.4 0.3 - 1.2 mg/dL   GFR, Estimated >40 >98 mL/min   Anion gap 10 5 - 15  CBC     Status: None   Collection Time: 06/09/23 12:56 AM  Result Value Ref Range   WBC 8.7 4.0 - 10.5 K/uL   RBC 5.12 4.22 - 5.81 MIL/uL   Hemoglobin 16.1 13.0 - 17.0 g/dL   HCT 11.9 14.7 - 82.9 %   MCV 90.2 80.0 - 100.0 fL   MCH 31.4 26.0 - 34.0 pg   MCHC 34.8 30.0 - 36.0 g/dL   RDW 56.2 13.0 - 86.5 %   Platelets 263 150 - 400 K/uL   nRBC 0.0 0.0 - 0.2 %  Urinalysis, Routine w reflex microscopic -Urine, Clean Catch     Status: Abnormal   Collection Time: 06/09/23 12:56 AM  Result Value Ref Range   Color, Urine YELLOW YELLOW    APPearance CLEAR CLEAR   Specific Gravity, Urine >1.046 (H) 1.005 - 1.030   pH 6.5 5.0 - 8.0   Glucose, UA NEGATIVE NEGATIVE mg/dL   Hgb urine dipstick LARGE (A) NEGATIVE   Bilirubin Urine NEGATIVE NEGATIVE   Ketones, ur 15 (A) NEGATIVE mg/dL   Protein, ur TRACE (A) NEGATIVE mg/dL   Nitrite NEGATIVE NEGATIVE   Leukocytes,Ua NEGATIVE NEGATIVE   RBC / HPF >50 0 - 5 RBC/hpf   WBC, UA 11-20 0 - 5 WBC/hpf   Bacteria, UA FEW (A) NONE SEEN   Squamous Epithelial / HPF 0-5 0 - 5 /HPF   Mucus PRESENT    No results found for this or any previous visit (from the past 240 hour(s)).  Renal Function: Recent Labs    06/09/23 0056  CREATININE 0.94   Estimated Creatinine Clearance: 97 mL/min (by C-G formula based on SCr of 0.94 mg/dL).  Radiologic Imaging: CT ABDOMEN PELVIS W CONTRAST  Result Date: 06/09/2023 CLINICAL DATA:  Right lower quadrant pain EXAM: CT ABDOMEN AND PELVIS WITH CONTRAST TECHNIQUE: Multidetector CT imaging of the abdomen and pelvis was performed using the standard protocol following bolus administration of intravenous contrast. RADIATION DOSE REDUCTION: This exam was performed according to the departmental dose-optimization program which includes automated exposure control, adjustment of the mA and/or kV according to patient size and/or use of iterative reconstruction technique. CONTRAST:  OMNIPAQUE IOHEXOL 300 MG/ML  SOLN COMPARISON:  05/05/2007, 08/25/2017 FINDINGS: Lower chest: No acute abnormality. Hepatobiliary: Mild periportal edema is noted. This is of uncertain significance and could be related to over hydration or hepatic inflammation. The gallbladder is decompressed. Pancreas: Unremarkable. No pancreatic ductal dilatation or surrounding inflammatory changes. Spleen: Normal in size without focal abnormality. Adrenals/Urinary Tract: Adrenal glands are within normal limits. Kidneys demonstrate a normal enhancement pattern bilaterally. Fullness of the collecting system on  the right is noted which extends into the proximal right ureter where a 6-7 mm stone is noted causing the obstructive change. The more distal right ureter is within normal limits. No renal calculi are noted on the left. The bladder is partially distended. Stomach/Bowel: Scattered diverticular change of the colon is noted. No diverticulitis is seen. The appendix is limits. Small bowel and stomach are unremarkable. Vascular/Lymphatic: Aortic atherosclerosis. No enlarged abdominal or pelvic lymph nodes. Reproductive: Prostate is unremarkable. Other: No abdominal wall hernia or abnormality. No abdominopelvic  ascites. Musculoskeletal: No acute or significant osseous findings. IMPRESSION: 6-7 mm proximal right ureteral stone with hydronephrosis and proximal hydroureter. Periportal edema within the liver of uncertain significance. Correlate with laboratory values. Diverticulosis without diverticulitis. Electronically Signed   By: Alcide Clever M.D.   On: 06/09/2023 02:47    Assessment:  Patrick Barr is a 57 y.o. year old M with right stone, hydro  Plan:  --to OR as planned for stent. Procedure and risks reviewed, including but not limited to hematuria, infection, sepsis, damage to GU tract, failure to complete procedure, need for future procedures, stent pain, prolonged stent.   Irine Seal, MD 06/09/2023, 4:06 PM  [facility] Pager: [phone]

## 2023-06-09 NOTE — ED Provider Notes (Signed)
[place] EMERGENCY DEPARTMENT AT [facility] Provider Note   CSN: [id number] Arrival date & time: 06/09/23  0036     History  Chief Complaint  Patient presents with   Abdominal Pain    [place] A Buel Ream is a 57 y.o. male.   Abdominal Pain Associated symptoms: nausea and vomiting      57 year old male with medical history significant for kidney stones who presents to the emergency department with sudden onset right lower abdominal pain.  Patient states that symptoms have been ongoing for the past hour.  He has had associated nausea and vomiting.  He has been hypertensive.  He endorses sharp pain in his right lower quadrant that radiates to his right flank.  It feels similar to previous episodes of kidney stones.  No fever, chills, dysuria or increased urinary frequency.  Home Medications Prior to Admission medications   Medication Sig Start Date End Date Taking? Authorizing Provider  cephALEXin (KEFLEX) 500 MG capsule Take 1 capsule (500 mg total) by mouth 2 (two) times daily for 5 days. 06/09/23 06/14/23 Yes Ernie Avena, MD  naproxen (NAPROSYN) 500 MG tablet Take 1 tablet (500 mg total) by mouth 2 (two) times daily. 06/09/23  Yes Ernie Avena, MD  tamsulosin (FLOMAX) 0.4 MG CAPS capsule Take 1 capsule (0.4 mg total) by mouth daily for 5 days. 06/09/23 06/14/23 Yes Ernie Avena, MD  EPINEPHrine (EPIPEN 2-PAK) 0.3 mg/0.3 mL IJ SOAJ injection Inject 0.3 mg into the muscle as needed.    [provider]  Multiple Vitamin (MULTIVITAMIN) tablet Take 1 tablet by mouth daily.    [provider]  traMADol (ULTRAM) 50 MG tablet Take 1-2 tablets (50-100 mg total) by mouth every 6 (six) hours as needed for moderate pain. Post-operatively Patient not taking: Reported on 08/21/2020 05/15/16   Loletta Parish., MD      Allergies    Bee venom and Wasp venom    Review of Systems   Review of Systems  Gastrointestinal:  Positive for abdominal pain, nausea and  vomiting.  Genitourinary:  Positive for flank pain.  All other systems reviewed and are negative.   Physical Exam Updated Vital Signs BP (!) 160/84   Pulse (!) 43   Temp 98.3 F (36.8 C)   Resp 16   Ht 5\' 10"  (1.778 m)   Wt 90.7 kg   SpO2 93%   BMI 28.70 kg/m  Physical Exam Vitals and nursing note reviewed.  Constitutional:      General: He is not in acute distress.    Appearance: He is well-developed. He is ill-appearing.  HENT:     Head: Normocephalic and atraumatic.  Eyes:     Conjunctiva/sclera: Conjunctivae normal.  Cardiovascular:     Rate and Rhythm: Normal rate and regular rhythm.     Heart sounds: No murmur heard. Pulmonary:     Effort: Pulmonary effort is normal. No respiratory distress.     Breath sounds: Normal breath sounds.  Abdominal:     Palpations: Abdomen is soft.     Tenderness: There is abdominal tenderness in the right lower quadrant. There is right CVA tenderness and guarding.  Musculoskeletal:        General: No swelling.     Cervical back: Neck supple.  Skin:    General: Skin is warm and dry.     Capillary Refill: Capillary refill takes less than 2 seconds.  Neurological:     Mental Status: He is alert.  Psychiatric:  Mood and Affect: Mood normal.     ED Results / Procedures / Treatments   Labs (all labs ordered are listed, but only abnormal results are displayed) Labs Reviewed  COMPREHENSIVE METABOLIC PANEL - Abnormal; Notable for the following components:      Result Value   Glucose, Bld 168 (*)    All other components within normal limits  URINALYSIS, ROUTINE W REFLEX MICROSCOPIC - Abnormal; Notable for the following components:   Specific Gravity, Urine >1.046 (*)    Hgb urine dipstick LARGE (*)    Ketones, ur 15 (*)    Protein, ur TRACE (*)    Bacteria, UA FEW (*)    All other components within normal limits  LIPASE, BLOOD  CBC    EKG EKG Interpretation Date/Time:  Monday June 09 2023 01:08:30 EDT Ventricular  Rate:  40 PR Interval:  148 QRS Duration:  97 QT Interval:  473 QTC Calculation: 386 R Axis:   60  Text Interpretation: Sinus bradycardia Confirmed by Ernie Avena ([id number]) on 06/09/2023 1:59:25 AM  Radiology CT ABDOMEN PELVIS W CONTRAST  Result Date: 06/09/2023 CLINICAL DATA:  Right lower quadrant pain EXAM: CT ABDOMEN AND PELVIS WITH CONTRAST TECHNIQUE: Multidetector CT imaging of the abdomen and pelvis was performed using the standard protocol following bolus administration of intravenous contrast. RADIATION DOSE REDUCTION: This exam was performed according to the departmental dose-optimization program which includes automated exposure control, adjustment of the mA and/or kV according to patient size and/or use of iterative reconstruction technique. CONTRAST:  OMNIPAQUE IOHEXOL 300 MG/ML  SOLN COMPARISON:  05/05/2007, 08/25/2017 FINDINGS: Lower chest: No acute abnormality. Hepatobiliary: Mild periportal edema is noted. This is of uncertain significance and could be related to over hydration or hepatic inflammation. The gallbladder is decompressed. Pancreas: Unremarkable. No pancreatic ductal dilatation or surrounding inflammatory changes. Spleen: Normal in size without focal abnormality. Adrenals/Urinary Tract: Adrenal glands are within normal limits. Kidneys demonstrate a normal enhancement pattern bilaterally. Fullness of the collecting system on the right is noted which extends into the proximal right ureter where a 6-7 mm stone is noted causing the obstructive change. The more distal right ureter is within normal limits. No renal calculi are noted on the left. The bladder is partially distended. Stomach/Bowel: Scattered diverticular change of the colon is noted. No diverticulitis is seen. The appendix is limits. Small bowel and stomach are unremarkable. Vascular/Lymphatic: Aortic atherosclerosis. No enlarged abdominal or pelvic lymph nodes. Reproductive: Prostate is unremarkable. Other: No  abdominal wall hernia or abnormality. No abdominopelvic ascites. Musculoskeletal: No acute or significant osseous findings. IMPRESSION: 6-7 mm proximal right ureteral stone with hydronephrosis and proximal hydroureter. Periportal edema within the liver of uncertain significance. Correlate with laboratory values. Diverticulosis without diverticulitis. Electronically Signed   By: Alcide Clever M.D.   On: 06/09/2023 02:47    Procedures Procedures    Medications Ordered in ED Medications  ondansetron (ZOFRAN) injection 4 mg (4 mg Intravenous Given 06/09/23 0103)  lactated ringers bolus 1,000 mL (0 mLs Intravenous Stopped 06/09/23 0245)  fentaNYL (SUBLIMAZE) injection 50 mcg (50 mcg Intravenous Given 06/09/23 0103)  iohexol (OMNIPAQUE) 300 MG/ML solution 100 mL (100 mLs Intravenous Contrast Given 06/09/23 0237)  ketorolac (TORADOL) 15 MG/ML injection 15 mg (15 mg Intravenous Given 06/09/23 0329)    ED Course/ Medical Decision Making/ A&P  Medical Decision Making Amount and/or Complexity of Data Reviewed Labs: ordered. Radiology: ordered.  Risk Prescription drug management.    57 year old male with medical history significant for kidney stones who presents to the emergency department with sudden onset right lower abdominal pain.  Patient states that symptoms have been ongoing for the past hour.  He has had associated nausea and vomiting.  He has been hypertensive.  He endorses sharp pain in his right lower quadrant that radiates to his right flank.  It feels similar to previous episodes of kidney stones.  No fever, chills, dysuria or increased urinary frequency.  On arrival, the patient was afebrile, not tachycardic or tachypneic, hypertensive BP 162/95, saturating 100% on room air.  Sinus bradycardia noted on cardiac telemetry on my evaluation.  This was status post fentanyl.  The patient presents with right lower quadrant tenderness with associated right flank  tenderness.  On exam he had right CVA tenderness with guarding and right lower quadrant tenderness.    Differential diagnosis includes most likely nephrolithiasis, considered UTI/pyelonephritis, appendicitis, small bowel obstruction, diverticulitis.  Labs: CBC without a leukocytosis or anemia, lipase normal, CMP with hyperglycemia to 168 otherwise unremarkable.  CT abdomen pelvis: IMPRESSION:  6-7 mm proximal right ureteral stone with hydronephrosis and  proximal hydroureter.    Periportal edema within the liver of uncertain significance.  Correlate with laboratory values.    Diverticulosis without diverticulitis.    The patient was administered IV Toradol, an LR bolus, IV fentanyl and IV Zofran.  On repeat assessment, the patient was feeling symptomatically improved.  Considered and offered admission for observation in the setting of the patient's pain however he was feeling improved and preferred to recover at home.  [facility].  Urinalysis Revealed evidence of mild UTI with 11-20 WBCs, few bacteria present.  Will treat with Keflex NSAIDs and Flomax.  Follow-up outpatient with urology.  Return for any severe worsening symptoms.   Final Clinical Impression(s) / ED Diagnoses Final diagnoses:  Kidney stone  Acute cystitis with hematuria  Hydronephrosis with urinary obstruction due to renal calculus    Rx / DC Orders ED Discharge Orders          Ordered    tamsulosin (FLOMAX) 0.4 MG CAPS capsule  Daily        06/09/23 0417    cephALEXin (KEFLEX) 500 MG capsule  2 times daily        06/09/23 0417    naproxen (NAPROSYN) 500 MG tablet  2 times daily        06/09/23 0417              Ernie Avena, MD 06/09/23 [phone]

## 2023-06-09 NOTE — Discharge Instructions (Addendum)
Your CT Results: IMPRESSION:  6-7 mm proximal right ureteral stone with hydronephrosis and  proximal hydroureter.    Periportal edema within the liver of uncertain significance.  Correlate with laboratory values.    Diverticulosis without diverticulitis.

## 2023-06-10 ENCOUNTER — Encounter (HOSPITAL_COMMUNITY): Payer: Self-pay | Admitting: Urology

## 2023-07-04 DIAGNOSIS — N130 Hydronephrosis with ureteropelvic junction obstruction: Secondary | ICD-10-CM | POA: Diagnosis not present

## 2023-07-04 DIAGNOSIS — N132 Hydronephrosis with renal and ureteral calculous obstruction: Secondary | ICD-10-CM | POA: Diagnosis not present

## 2023-07-05 ENCOUNTER — Telehealth: Payer: Self-pay

## 2023-07-05 MED ORDER — ONDANSETRON HCL 4 MG PO TABS: 4.0000 mg | ORAL_TABLET | Freq: Every day | ORAL | 1 refills | Status: AC | PRN

## 2023-07-05 NOTE — Telephone Encounter (Signed)
Patient calling with stent discomfort and nausea. Discussed stent medications and ED return precautions. Zofran to be sent to preferred pharmacy.

## 2023-07-06 ENCOUNTER — Other Ambulatory Visit: Payer: Self-pay

## 2023-08-15 DIAGNOSIS — N201 Calculus of ureter: Secondary | ICD-10-CM | POA: Diagnosis not present
